# Patient Record
Sex: Male | Born: 1970 | Race: Black or African American | Hispanic: No | Marital: Single | Smoking: Current every day smoker
Health system: Southern US, Community
[De-identification: ages and names within clinical notes are randomized; demographics above are authoritative.]

## PROBLEM LIST (undated history)

## (undated) DIAGNOSIS — J45909 Unspecified asthma, uncomplicated: Secondary | ICD-10-CM

## (undated) DIAGNOSIS — Z889 Allergy status to unspecified drugs, medicaments and biological substances status: Secondary | ICD-10-CM

## (undated) DIAGNOSIS — I10 Essential (primary) hypertension: Secondary | ICD-10-CM

## (undated) HISTORY — PX: APPENDECTOMY: SHX54

---

## 2007-07-23 ENCOUNTER — Inpatient Hospital Stay (HOSPITAL_COMMUNITY): Admission: EM | Admit: 2007-07-23 | Discharge: 2007-07-24 | Payer: Self-pay | Admitting: Emergency Medicine

## 2007-07-23 ENCOUNTER — Encounter (INDEPENDENT_AMBULATORY_CARE_PROVIDER_SITE_OTHER): Payer: Self-pay | Admitting: General Surgery

## 2008-09-20 ENCOUNTER — Emergency Department (HOSPITAL_COMMUNITY): Admission: EM | Admit: 2008-09-20 | Discharge: 2008-09-21 | Payer: Self-pay | Admitting: Emergency Medicine

## 2008-12-03 ENCOUNTER — Emergency Department (HOSPITAL_COMMUNITY): Admission: EM | Admit: 2008-12-03 | Discharge: 2008-12-03 | Payer: Self-pay | Admitting: Emergency Medicine

## 2010-03-22 ENCOUNTER — Emergency Department (HOSPITAL_COMMUNITY): Admission: EM | Admit: 2010-03-22 | Discharge: 2010-03-22 | Payer: Self-pay | Admitting: Emergency Medicine

## 2010-10-21 NOTE — H&P (Signed)
NAMEROBT, OKUDA        ACCOUNT NO.:  [id number]   MEDICAL RECORD NO.:  [id number]          PATIENT TYPE:  INP   LOCATION:  0103                         FACILITY:  [facility]   PHYSICIAN:  Anselm Pancoast. Weatherly, M.D.DATE OF BIRTH:  Aug 30, 1970   DATE OF ADMISSION:  07/23/2007  DATE OF DISCHARGE:                              HISTORY & PHYSICAL   HISTORY:  Carlos Sanford is a 40 year old black male who presented  to the emergency room with the following history.  He said he felt fine  yesterday.  In the early a.m., he started having abdominal pain that  shifted into the lower abdomen.  During the day, the pain just got  worse.  He came to the emergency room late afternoon and was seen by the  ER physician.  He was tender in the lower abdomen.  A white count was  performed which was mildly elevated.  Urinalysis was negative and then a  CT was ordered.  The CT showed an acutely inflamed appendix with large  fecalith in it in the right lower quadrant.  There is no evidence of any  rupture.  I was called about 10 p.m. when the CT was completed.  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   PAST MEDICAL HISTORY:  Not really asthma, but breathing issues.   ALLERGIES:  He said PENICILLIN causes him to swell.   REVIEW OF SYSTEMS:  Otherwise negative.   PAST SURGICAL HISTORY:  He has had no previous surgeries.   MEDICATIONS:  No chronic medications.   PHYSICAL EXAMINATION:  VITAL SIGNS:  When he presented, his temperature  was 98, blood pressure 151/86, respiration 16.  EYES/EARS/NOSE/THROAT:  Unremarkable.  LUNGS:  Clear.  CARDIAC:  Normal sinus rhythm.  ABDOMEN:  He is definitely tender in the right lower quadrant with  muscle guarding and rebound.  RECTAL:  I did not do a rectal/pelvic exams since he had a CT that  showed acutely inflamed appendix.  EXTREMITIES:  There was no pedal edema.  CNS:  Physiologic.   ADMISSION IMPRESSION:  Acute appendicitis.   PLAN:  I  plan to do a laparoscopic appendectomy promptly.  We will give  him 400 mg of Cipro and permission obtained for laparoscopic  appendectomy.           ______________________________  Anselm Pancoast. Zachery Dakins, M.D.    WJW/MEDQ  D:  07/24/2007  T:  07/25/2007  Job:  [id number]

## 2010-10-21 NOTE — Op Note (Signed)
NAMEKINTE, TRIM        ACCOUNT NO.:  [id number]   MEDICAL RECORD NO.:  [id number]          PATIENT TYPE:  INP   LOCATION:  1534                         FACILITY:  [facility]   PHYSICIAN:  Anselm Pancoast. Weatherly, M.D.DATE OF BIRTH:  01-May-1971   DATE OF PROCEDURE:  07/23/2007  DATE OF DISCHARGE:  07/24/2007                               OPERATIVE REPORT   PREOPERATIVE DIAGNOSIS:  Acute appendicitis.   POSTOPERATIVE DIAGNOSIS:  Acute appendicitis.   OPERATION:  Laparoscopic appendectomy.   SURGEON:  Anselm Pancoast. Zachery Dakins, M.D.   ASSISTANT:  Nurse.   HISTORY:  Mikail Goostree is a 40 year old male presented to the  emergency room today with about 20-hour history of progressive abdominal  pain that progressed to the right lower quadrant.  The patient has had  no previous abdominal surgery and is otherwise in good health CT showed  acutely inflamed appendix.  His white count was 13,500.  He was  afebrile.  He was given 400 mg of Cipro permission obtained for  laparoscopic appendectomy.  He was taken over to the operative suite.  Induction of general anesthesia, endotracheal tube, Foley catheter was  inserted sterilely and then the abdomen was shaved around the umbilicus  in areas where trocars were to be inserted.  Betadine solution prepped  the abdomen.  Then I made a small incision just below the umbilicus.  You could feel a little fascial defect in the umbilicus itself and the  skin was elevated from this and then I made a little opening right  through this.  A pursestring suture of 0-0 Vicryl was placed and Hasson  cannula introduced.  You could see a little inflammatory reaction in the  right lower quadrant.  The 10/11 port was placed in the left lower  quadrant and a 5 mm port placed lateral upper right abdomen.  I could  see the appendix and grab it with a Glassman and then could kind of pull  it upward and outward.  There was some adhesions going to the inferior  surface that were carefully divided with the harmonic scalpel.  The  appendix was freed right onto its base.  You could see this large area  fecalith and then we got below this, divided the appendiceal mesentery,  good hemostasis so I see where the appendix definitely joins the cecum.  I then used the endoscopic GIA with a GI load placed across the base of  the appendix, cecum clamped and under direct vision fired the stapler.  The appendix was then placed in the EndoCatch bag and then brought out  with removing the Hasson port.  I then reinserted the Hasson cannula,  irrigated.  The staple line was not bleeding.  There was good  hemostasis.  A little bit of inflammatory reaction around this was  irrigated and aspirated.  I then removed the 10/11 port under direct  vision having placed the camera back in the umbilical port.  The camera  was in the left lower quadrant and I was working predominantly through  the umbilicus and right.  The 5 mL port withdrawn under direct vision.  I then put an  additional figure-of-eight suture of 0-0 Vicryl in the  fascia at the umbilicus, Marcaine and Adrenalin and then one stitch in  the fascia in the left lower quadrant.  The subcuticular  wounds closed with 4-0 Monocryl and Benzoin and Steri-Strips on skin.  The patient tolerated procedure nicely and should be able to go home in  the morning.  Will give him clear liquids and I do not think he will  need any additional antibiotics.           ______________________________  Anselm Pancoast. Zachery Dakins, M.D.     WJW/MEDQ  D:  07/24/2007  T:  07/25/2007  Job:  [id number]

## 2011-02-27 LAB — CBC
HCT: 47.6
Hemoglobin: 16.6
MCHC: 34.8
Platelets: 209
RDW: 13.5

## 2011-02-27 LAB — DIFFERENTIAL
Basophils Relative: 0
Lymphocytes Relative: 9 — ABNORMAL LOW
Lymphs Abs: 1.2
Monocytes Absolute: 0.3
Monocytes Relative: 2 — ABNORMAL LOW
Neutro Abs: 12 — ABNORMAL HIGH
Neutrophils Relative %: 89 — ABNORMAL HIGH

## 2011-02-27 LAB — URINALYSIS, ROUTINE W REFLEX MICROSCOPIC
Glucose, UA: NEGATIVE
Hgb urine dipstick: NEGATIVE
Leukocytes, UA: NEGATIVE
Protein, ur: 30 — AB
Specific Gravity, Urine: 1.033 — ABNORMAL HIGH
Urobilinogen, UA: 1

## 2011-02-27 LAB — URINE MICROSCOPIC-ADD ON

## 2011-02-27 LAB — COMPREHENSIVE METABOLIC PANEL
Albumin: 4.3
BUN: 11
Calcium: 9.4
Creatinine, Ser: 1.09
Glucose, Bld: 156 — ABNORMAL HIGH
Total Protein: 7.8

## 2011-04-20 ENCOUNTER — Emergency Department (HOSPITAL_COMMUNITY): Payer: No Typology Code available for payment source

## 2011-04-20 ENCOUNTER — Emergency Department (HOSPITAL_COMMUNITY)
Admission: EM | Admit: 2011-04-20 | Discharge: 2011-04-20 | Disposition: A | Discharge status: Home / Self Care | Payer: No Typology Code available for payment source | Attending: Emergency Medicine | Admitting: Emergency Medicine

## 2011-04-20 DIAGNOSIS — S139XXA Sprain of joints and ligaments of unspecified parts of neck, initial encounter: Secondary | ICD-10-CM | POA: Insufficient documentation

## 2011-04-20 DIAGNOSIS — M25519 Pain in unspecified shoulder: Secondary | ICD-10-CM | POA: Insufficient documentation

## 2011-04-20 DIAGNOSIS — S8000XA Contusion of unspecified knee, initial encounter: Secondary | ICD-10-CM | POA: Insufficient documentation

## 2011-04-20 DIAGNOSIS — S161XXA Strain of muscle, fascia and tendon at neck level, initial encounter: Secondary | ICD-10-CM

## 2011-04-20 DIAGNOSIS — M546 Pain in thoracic spine: Secondary | ICD-10-CM | POA: Insufficient documentation

## 2011-04-20 DIAGNOSIS — R0789 Other chest pain: Secondary | ICD-10-CM

## 2011-04-20 DIAGNOSIS — Y92410 Unspecified street and highway as the place of occurrence of the external cause: Secondary | ICD-10-CM | POA: Insufficient documentation

## 2011-04-20 DIAGNOSIS — R071 Chest pain on breathing: Secondary | ICD-10-CM | POA: Insufficient documentation

## 2011-04-20 MED ORDER — CYCLOBENZAPRINE HCL 10 MG PO TABS: 10.0000 mg | ORAL_TABLET | Freq: Three times a day (TID) | ORAL | Status: DC | PRN

## 2011-04-20 MED ORDER — IBUPROFEN 800 MG PO TABS: 800.0000 mg | ORAL_TABLET | Freq: Three times a day (TID) | ORAL | Status: AC | PRN

## 2011-04-20 MED ORDER — HYDROCODONE-ACETAMINOPHEN 5-325 MG PO TABS: 1.0000 | ORAL_TABLET | Freq: Four times a day (QID) | ORAL | Status: AC | PRN

## 2011-04-20 MED ORDER — DIAZEPAM 5 MG PO TABS: 5.0000 mg | ORAL_TABLET | Freq: Three times a day (TID) | ORAL | Status: AC | PRN
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
## 2011-04-20 NOTE — ED Notes (Signed)
3 rx given to go.
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
## 2011-04-21 NOTE — ED Provider Notes (Signed)
Medical screening examination/treatment/procedure(s) were performed by non-physician practitioner and as supervising physician I was immediately available for consultation/collaboration.    Rella Egelston R Noella Kipnis, MD 04/21/11 0000 

## 2011-12-23 ENCOUNTER — Emergency Department (HOSPITAL_COMMUNITY)
Admission: EM | Admit: 2011-12-23 | Discharge: 2011-12-23 | Disposition: A | Discharge status: Home / Self Care | Payer: Self-pay | Attending: Emergency Medicine | Admitting: Emergency Medicine

## 2011-12-23 ENCOUNTER — Encounter (HOSPITAL_COMMUNITY): Payer: Self-pay | Admitting: *Deleted

## 2011-12-23 DIAGNOSIS — S1096XA Insect bite of unspecified part of neck, initial encounter: Secondary | ICD-10-CM | POA: Insufficient documentation

## 2011-12-23 DIAGNOSIS — F17200 Nicotine dependence, unspecified, uncomplicated: Secondary | ICD-10-CM | POA: Insufficient documentation

## 2011-12-23 DIAGNOSIS — Y92009 Unspecified place in unspecified non-institutional (private) residence as the place of occurrence of the external cause: Secondary | ICD-10-CM | POA: Insufficient documentation

## 2011-12-23 DIAGNOSIS — W57XXXA Bitten or stung by nonvenomous insect and other nonvenomous arthropods, initial encounter: Secondary | ICD-10-CM | POA: Insufficient documentation

## 2011-12-23 HISTORY — DX: Allergy status to unspecified drugs, medicaments and biological substances: Z88.9

## 2011-12-23 HISTORY — DX: Unspecified asthma, uncomplicated: J45.909

## 2011-12-23 MED ORDER — CEPHALEXIN 500 MG PO CAPS: 500.0000 mg | ORAL_CAPSULE | Freq: Four times a day (QID) | ORAL | Status: AC

## 2011-12-23 MED ORDER — HYDROCHLOROTHIAZIDE 25 MG PO TABS: 25.0000 mg | ORAL_TABLET | Freq: Every day | ORAL | Status: DC

## 2011-12-23 NOTE — ED Notes (Signed)
Patient reports he thinks a spider bit his neck on Saturday.  He has noted large raised area with dark center

## 2011-12-23 NOTE — ED Provider Notes (Signed)
History   This chart was scribed for Nelia Shi, MD by Sofie Rower. The patient was seen in room TR04C/TR04C and the patient's care was started at 11:44 AM     CSN: [id number]  Arrival date & time 12/23/11  1131   None     Chief Complaint  Patient presents with  . Insect Bite    (Consider location/radiation/quality/duration/timing/severity/associated sxs/prior treatment) Patient is a 41 y.o. male presenting with animal bite. The history is provided by the patient. No language interpreter was used.  Animal Bite  The incident occurred more than 2 days ago. The incident occurred at home. He came to the ER via personal transport. Head/neck injury location: bite location to the back of neck. The pain is mild. It is unlikely that a foreign body is present. Pertinent negatives include no fussiness, no numbness, no abdominal pain, no vomiting, no neck pain and no light-headedness. There have been no prior injuries to these areas. His tetanus status is unknown. He has been behaving normally. There were no sick contacts. He has received no recent medical care.    Tykee Heideman is a 41 y.o. male who presents to the Emergency Department complaining of moderate, episodic insect bite onset four days ago. The pt informs the EDP that he believes he was bit by an insect on the back of his neck, where he proceeded to slap the bite location.   Pt has a hx of allergy to penicillins.     Past Medical History  Diagnosis Date  . Asthma   . Multiple allergies     Past Surgical History  Procedure Date  . Appendectomy     No family history on file.  History  Substance Use Topics  . Smoking status: Current Everyday Smoker  . Smokeless tobacco: Not on file  . Alcohol Use: Yes     occasionally      Review of Systems  HENT: Negative for neck pain.   Gastrointestinal: Negative for vomiting and abdominal pain.  Neurological: Negative for light-headedness and numbness.  All other  systems reviewed and are negative.    10 Systems reviewed and all are negative for acute change except as noted in the HPI.    Allergies  Penicillins  Home Medications   Current Outpatient Rx  Name Route Sig Dispense Refill  . NAPROXEN SODIUM 220 MG PO TABS Oral Take 440 mg by mouth 2 (two) times daily as needed. headache    . CEPHALEXIN 500 MG PO CAPS Oral Take 1 capsule (500 mg total) by mouth 4 (four) times daily. 20 capsule 0    BP 175/109  Pulse 85  Temp 98.3 F (36.8 C) (Oral)  Resp 18  SpO2 98%  Physical Exam  Nursing note and vitals reviewed. Constitutional: He is oriented to person, place, and time. He appears well-developed and well-nourished. No distress.  HENT:  Head: Normocephalic and atraumatic.  Nose: Nose normal.  Eyes: Pupils are equal, round, and reactive to light.  Neck: Normal range of motion.    Cardiovascular: Normal rate and intact distal pulses.   Pulmonary/Chest: Effort normal. No respiratory distress.  Abdominal: Normal appearance. He exhibits no distension.  Musculoskeletal: Normal range of motion.  Neurological: He is alert and oriented to person, place, and time. No cranial nerve deficit.  Skin: Skin is warm and dry. No rash noted.  Psychiatric: He has a normal mood and affect. His behavior is normal.    ED Course  Procedures (including critical care  time)  DIAGNOSTIC STUDIES: Oxygen Saturation is 98% on room air, normal by my interpretation.    COORDINATION OF CARE:    11:45AM- EDP at bedside discusses treatment plan concerning application of antibiotics.   Labs Reviewed - No data to display No results found.   1. Insect bite       MDM         I personally performed the services described in this documentation, which was scribed in my presence. The recorded information has been reviewed and considered.    Nelia Shi, MD 12/23/11 [phone]

## 2012-10-21 ENCOUNTER — Emergency Department (HOSPITAL_COMMUNITY): Payer: Self-pay

## 2012-10-21 ENCOUNTER — Encounter (HOSPITAL_COMMUNITY): Payer: Self-pay | Admitting: *Deleted

## 2012-10-21 ENCOUNTER — Inpatient Hospital Stay (HOSPITAL_COMMUNITY)
Admission: EM | Admit: 2012-10-21 | Discharge: 2012-10-24 | DRG: 195 | Disposition: A | Discharge status: Home / Self Care | Payer: 59 | Attending: Internal Medicine | Admitting: Internal Medicine

## 2012-10-21 DIAGNOSIS — J45909 Unspecified asthma, uncomplicated: Secondary | ICD-10-CM | POA: Diagnosis present

## 2012-10-21 DIAGNOSIS — J40 Bronchitis, not specified as acute or chronic: Secondary | ICD-10-CM | POA: Diagnosis present

## 2012-10-21 DIAGNOSIS — R112 Nausea with vomiting, unspecified: Secondary | ICD-10-CM

## 2012-10-21 DIAGNOSIS — R079 Chest pain, unspecified: Secondary | ICD-10-CM

## 2012-10-21 DIAGNOSIS — R9431 Abnormal electrocardiogram [ECG] [EKG]: Secondary | ICD-10-CM

## 2012-10-21 DIAGNOSIS — F17200 Nicotine dependence, unspecified, uncomplicated: Secondary | ICD-10-CM | POA: Diagnosis present

## 2012-10-21 DIAGNOSIS — Z9119 Patient's noncompliance with other medical treatment and regimen: Secondary | ICD-10-CM

## 2012-10-21 DIAGNOSIS — I10 Essential (primary) hypertension: Secondary | ICD-10-CM | POA: Diagnosis present

## 2012-10-21 DIAGNOSIS — IMO0002 Reserved for concepts with insufficient information to code with codable children: Secondary | ICD-10-CM

## 2012-10-21 DIAGNOSIS — J189 Pneumonia, unspecified organism: Principal | ICD-10-CM

## 2012-10-21 DIAGNOSIS — Z720 Tobacco use: Secondary | ICD-10-CM

## 2012-10-21 DIAGNOSIS — Z91199 Patient's noncompliance with other medical treatment and regimen due to unspecified reason: Secondary | ICD-10-CM

## 2012-10-21 DIAGNOSIS — R0789 Other chest pain: Secondary | ICD-10-CM | POA: Diagnosis present

## 2012-10-21 HISTORY — DX: Essential (primary) hypertension: I10

## 2012-10-21 LAB — COMPREHENSIVE METABOLIC PANEL WITH GFR
AST: 15 U/L (ref 0–37)
BUN: 9 mg/dL (ref 6–23)
CO2: 29 meq/L (ref 19–32)
Calcium: 9.8 mg/dL (ref 8.4–10.5)
Chloride: 102 meq/L (ref 96–112)
Creatinine, Ser: 1.1 mg/dL (ref 0.50–1.35)
GFR calc Af Amer: >90 mL/min (ref >90)
GFR calc non Af Amer: 81 mL/min — ABNORMAL LOW (ref >90)
Glucose, Bld: 109 mg/dL — ABNORMAL HIGH (ref 70–99)
Total Bilirubin: 0.5 mg/dL (ref 0.3–1.2)

## 2012-10-21 LAB — CBC WITH DIFFERENTIAL/PLATELET
Basophils Absolute: 0 10*3/uL (ref 0.0–0.1)
Basophils Relative: 0 % (ref 0–1)
Eosinophils Absolute: 0 K/uL (ref 0.0–0.7)
Eosinophils Relative: 0 % (ref 0–5)
HCT: 44.4 % (ref 39.0–52.0)
Hemoglobin: 16.1 g/dL (ref 13.0–17.0)
Lymphocytes Relative: 13 % (ref 12–46)
Lymphs Abs: 1.3 K/uL (ref 0.7–4.0)
MCH: 30.6 pg (ref 26.0–34.0)
MCHC: 36.3 g/dL — ABNORMAL HIGH (ref 30.0–36.0)
MCV: 84.3 fL (ref 78.0–100.0)
Monocytes Absolute: 0.5 10*3/uL (ref 0.1–1.0)
Monocytes Relative: 4 % (ref 3–12)
Neutro Abs: 8.6 K/uL — ABNORMAL HIGH (ref 1.7–7.7)
Neutrophils Relative %: 83 % — ABNORMAL HIGH (ref 43–77)
Platelets: 268 K/uL (ref 150–400)
RBC: 5.27 MIL/uL (ref 4.22–5.81)
RDW: 13.2 % (ref 11.5–15.5)
WBC: 10.4 K/uL (ref 4.0–10.5)

## 2012-10-21 LAB — COMPREHENSIVE METABOLIC PANEL
ALT: 18 U/L (ref 0–53)
Albumin: 4.3 g/dL (ref 3.5–5.2)
Alkaline Phosphatase: 72 U/L (ref 39–117)
Potassium: 4.2 mEq/L (ref 3.5–5.1)
Sodium: 138 mEq/L (ref 135–145)
Total Protein: 7.9 g/dL (ref 6.0–8.3)

## 2012-10-21 LAB — RAPID URINE DRUG SCREEN, HOSP PERFORMED
Amphetamines: NOT DETECTED
Barbiturates: NOT DETECTED
Benzodiazepines: NOT DETECTED
Cocaine: NOT DETECTED
Opiates: NOT DETECTED
Tetrahydrocannabinol: NOT DETECTED

## 2012-10-21 MED ORDER — ASPIRIN 81 MG PO CHEW
162.0000 mg | CHEWABLE_TABLET | Freq: Once | ORAL | Status: AC
  Administered 2012-10-21: 162 mg via ORAL
  Filled 2012-10-21: qty 2

## 2012-10-21 MED ORDER — NITROGLYCERIN 0.4 MG SL SUBL
0.4000 mg | SUBLINGUAL_TABLET | SUBLINGUAL | Status: DC | PRN
  Administered 2012-10-21: 0.4 mg via SUBLINGUAL
  Filled 2012-10-21: qty 25

## 2012-10-21 MED ORDER — FENTANYL CITRATE 0.05 MG/ML IJ SOLN
50.0000 ug | Freq: Once | INTRAMUSCULAR | Status: AC
  Administered 2012-10-21: 50 ug via INTRAVENOUS
  Filled 2012-10-21: qty 2

## 2012-10-21 MED ORDER — ONDANSETRON HCL 4 MG/2ML IJ SOLN
4.0000 mg | Freq: Once | INTRAMUSCULAR | Status: AC
  Administered 2012-10-21: 4 mg via INTRAVENOUS
  Filled 2012-10-21: qty 2

## 2012-10-21 MED ORDER — SODIUM CHLORIDE 0.9 % IJ SOLN: 10.0000 mL | INTRAMUSCULAR | Status: DC | PRN

## 2012-10-21 MED ORDER — NITROGLYCERIN IN D5W 200-5 MCG/ML-% IV SOLN
2.0000 ug/min | Freq: Once | INTRAVENOUS | Status: AC
  Administered 2012-10-22: 5 ug/min via INTRAVENOUS
  Filled 2012-10-21: qty 250

## 2012-10-21 NOTE — ED Notes (Signed)
Pt states that he had high BP prior to CP. Pt states approx 5 hours before his CP started at 10:00 he felt like his BP was elevated. Pt states that he was nauseated and vomiting. Pt states at 10:00 his left side of his chest started having sharp intermittant pain. Pt states that he went to check his BP and it was 184/114. Pt came here after.

## 2012-10-21 NOTE — ED Notes (Signed)
Pt c/o upper left upper chest that is worse with movement and breathing. Pt slightly anxious

## 2012-10-21 NOTE — ED Provider Notes (Signed)
History     CSN: [id number]  Arrival date & time 10/21/12  1929   First MD Initiated Contact with Patient 10/21/12 2050      Chief Complaint  Patient presents with  . Hypertension  . Chest Pain    (Consider location/radiation/quality/duration/timing/severity/associated sxs/prior treatment) HPI Comments: Patient with history of asthma, undiagnosed and untreated hypertension, presents with generalized weakness, drowsiness most of today. He reports 2 episodes of nausea and vomiting without diarrhea or abdominal pain. He has had some chills and episodes of diaphoresis throughout the day. About 2 hours prior to arrival, the patient began having some substernal nonradiating chest discomfort which is improved but still present. He denies prior history of cardiac disease. He does smoke cigarettes, reports both parents have had MIs and problems with congestive heart failure and coronary disease in the past. He does not have a primary care physician. He did not take any aspirin or any other medications prior to arrival. Patient reports subsequently he has developed somewhat of a headache mostly on the left side of his head that is sharp in nature. He denies blurry vision, photophobia, stiff neck. Denies any skin rash, abdominal pain, flank pain.  The history is provided by the patient and a relative.    Past Medical History  Diagnosis Date  . Asthma   . Multiple allergies   . Bronchitis     Past Surgical History  Procedure Laterality Date  . Appendectomy      History reviewed. No pertinent family history.  History  Substance Use Topics  . Smoking status: Current Every Day Smoker  . Smokeless tobacco: Not on file  . Alcohol Use: Yes     Comment: occasionally      Review of Systems  Constitutional: Positive for chills, diaphoresis and fatigue. Negative for fever.  HENT: Negative for congestion, rhinorrhea, neck pain and neck stiffness.   Eyes: Negative for photophobia and visual  disturbance.  Respiratory: Positive for chest tightness and shortness of breath. Negative for cough.   Cardiovascular: Positive for chest pain. Negative for palpitations.  Gastrointestinal: Positive for nausea and vomiting. Negative for abdominal pain and diarrhea.  Genitourinary: Negative for dysuria and flank pain.  Musculoskeletal: Negative for back pain.  Skin: Negative for rash.  Neurological: Positive for headaches. Negative for syncope and light-headedness.  All other systems reviewed and are negative.    Allergies  Penicillins  Home Medications  No current outpatient prescriptions on file.  BP 152/101  Pulse 66  Temp(Src) 98.3 F (36.8 C) (Oral)  Resp 14  SpO2 97%  Physical Exam  Nursing note and vitals reviewed. Constitutional: He appears well-developed and well-nourished. No distress.  HENT:  Head: Normocephalic and atraumatic.  Eyes: Conjunctivae and EOM are normal. No scleral icterus.  Neck: Normal range of motion. Neck supple.  Cardiovascular: Normal rate, regular rhythm and intact distal pulses.   No murmur heard. Pulmonary/Chest: Effort normal. No respiratory distress. He has no wheezes.  Abdominal: Soft. He exhibits no distension. There is no tenderness. There is no rebound and no guarding.  Neurological: He is alert. He exhibits normal muscle tone. Coordination normal.  Skin: Skin is warm. He is not diaphoretic.  Psychiatric: He has a normal mood and affect.    ED Course  Procedures (including critical care time)    CRITICAL CARE Performed by: Lear Ng. Total critical care time: 30 min Critical care time was exclusive of separately billable procedures and treating other patients. Critical care was necessary to treat  or prevent imminent or life-threatening deterioration. Critical care was time spent personally by me on the following activities: development of treatment plan with patient and/or surrogate as well as nursing, discussions with  consultants, evaluation of patient's response to treatment, examination of patient, obtaining history from patient or surrogate, ordering and performing treatments and interventions, ordering and review of laboratory studies, ordering and review of radiographic studies, pulse oximetry and re-evaluation of patient's condition.   Labs Reviewed  COMPREHENSIVE METABOLIC PANEL - Abnormal; Notable for the following:    Glucose, Bld 109 (*)    GFR calc non Af Amer 81 (*)    All other components within normal limits  CBC WITH DIFFERENTIAL - Abnormal; Notable for the following:    MCHC 36.3 (*)    Neutrophils Relative % 83 (*)    Neutro Abs 8.6 (*)    All other components within normal limits  URINE RAPID DRUG SCREEN (HOSP PERFORMED)  POCT I-STAT TROPONIN I   Dg Chest 2 View  10/21/2012   *RADIOLOGY REPORT*  Clinical Data: Elevated blood pressure.  Chest pain and headache.  CHEST - 2 VIEW  Comparison: Chest radiograph 04/20/2011  Findings: The heart, mediastinal, and hilar contours are normal. Normal pulmonary vascularity.  Midline trachea.  The lungs are clear.  There is no pleural effusion or pneumothorax.  Bony thorax is unremarkable.  IMPRESSION: No acute cardiopulmonary disease.   Original Report Authenticated By: Britta Mccreedy, M.D.     1. Chest pain   2. Nausea and vomiting   3. Hypertension   4. Abnormal ECG     Room air saturation is 97% I interpret this to be normal.  EKG at time 19:42, shows normal sinus rhythm at a rate of 60, inferolateral ST and T-wave abnormalities with J-point elevation in anterior septal leads. Interpretation is abnormal EKG  11:40 PM Pt reports chest tightness is down from a 9/10 to about 5/10 after NTG.  Still with BP of 180/114, will start NTG drip for continued CP and possibly HTN crisis.  Initial troponin is neg, head CT shows no hemorrhage on my interpretation.  Will consult [facility] to admit, likely needs step down due to continued CP and NTG  gtt.    MDM   Patient with somewhat atypical chest pain as it began several hours and to symptom onset. Patient has been fatigued, sleepy all day, associated with nausea and vomiting, headache, generalized weakness. No fever here. Patient is quite hypertensive with history of being noncompliant. Patient does smoke, denies any drug use. 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 His abdomen is soft and no guarding or rebound. Initial troponin is normal. However given his chest pain, diaphoresis, nausea and vomiting, patient will warrant admission for cardiac rule out, and treatment of his hypertension. No focal neurologic deficits, despite his headache, I don't suspect the patient has stroke symptoms        Carlos Sanford. Oletta Lamas, MD 10/21/12 [phone]

## 2012-10-22 ENCOUNTER — Encounter (HOSPITAL_COMMUNITY): Payer: Self-pay | Admitting: *Deleted

## 2012-10-22 ENCOUNTER — Observation Stay (HOSPITAL_COMMUNITY): Payer: Self-pay

## 2012-10-22 DIAGNOSIS — R079 Chest pain, unspecified: Secondary | ICD-10-CM

## 2012-10-22 DIAGNOSIS — J189 Pneumonia, unspecified organism: Principal | ICD-10-CM

## 2012-10-22 DIAGNOSIS — J40 Bronchitis, not specified as acute or chronic: Secondary | ICD-10-CM | POA: Diagnosis present

## 2012-10-22 DIAGNOSIS — R0789 Other chest pain: Secondary | ICD-10-CM | POA: Diagnosis present

## 2012-10-22 DIAGNOSIS — I10 Essential (primary) hypertension: Secondary | ICD-10-CM | POA: Diagnosis present

## 2012-10-22 DIAGNOSIS — J45909 Unspecified asthma, uncomplicated: Secondary | ICD-10-CM

## 2012-10-22 DIAGNOSIS — R9431 Abnormal electrocardiogram [ECG] [EKG]: Secondary | ICD-10-CM

## 2012-10-22 DIAGNOSIS — Z720 Tobacco use: Secondary | ICD-10-CM

## 2012-10-22 DIAGNOSIS — I517 Cardiomegaly: Secondary | ICD-10-CM

## 2012-10-22 LAB — CREATININE, SERUM
Creatinine, Ser: 1.06 mg/dL (ref 0.50–1.35)
GFR calc non Af Amer: 85 mL/min — ABNORMAL LOW (ref >90)

## 2012-10-22 LAB — CBC
Hemoglobin: 15.5 g/dL (ref 13.0–17.0)
MCHC: 36.6 g/dL — ABNORMAL HIGH (ref 30.0–36.0)
Platelets: 259 10*3/uL (ref 150–400)
RBC: 5.07 MIL/uL (ref 4.22–5.81)

## 2012-10-22 LAB — TROPONIN I
Troponin I: <0.3 ng/mL (ref <0.30)
Troponin I: <0.3 ng/mL (ref <0.30)
Troponin I: <0.3 ng/mL (ref <0.30)

## 2012-10-22 MED ORDER — SODIUM CHLORIDE 0.9 % IJ SOLN
3.0000 mL | Freq: Two times a day (BID) | INTRAMUSCULAR | Status: DC
  Administered 2012-10-22 – 2012-10-23 (×3): 3 mL via INTRAVENOUS

## 2012-10-22 MED ORDER — SODIUM CHLORIDE 0.9 % IV SOLN: 250.0000 mL | INTRAVENOUS | Status: DC | PRN

## 2012-10-22 MED ORDER — IOHEXOL 350 MG/ML SOLN
100.0000 mL | Freq: Once | INTRAVENOUS | Status: AC | PRN
  Administered 2012-10-22: 100 mL via INTRAVENOUS

## 2012-10-22 MED ORDER — ALBUTEROL SULFATE (5 MG/ML) 0.5% IN NEBU
INHALATION_SOLUTION | RESPIRATORY_TRACT | Status: AC
  Filled 2012-10-22: qty 1

## 2012-10-22 MED ORDER — AZITHROMYCIN 500 MG PO TABS
500.0000 mg | ORAL_TABLET | Freq: Every day | ORAL | Status: DC
  Administered 2012-10-23 – 2012-10-24 (×2): 500 mg via ORAL
  Filled 2012-10-22 (×2): qty 1

## 2012-10-22 MED ORDER — MORPHINE SULFATE 4 MG/ML IJ SOLN
4.0000 mg | Freq: Once | INTRAMUSCULAR | Status: AC
  Administered 2012-10-22: 4 mg via INTRAVENOUS
  Filled 2012-10-22: qty 1

## 2012-10-22 MED ORDER — IPRATROPIUM BROMIDE 0.02 % IN SOLN
RESPIRATORY_TRACT | Status: AC
  Filled 2012-10-22: qty 2.5

## 2012-10-22 MED ORDER — ASPIRIN EC 325 MG PO TBEC
325.0000 mg | DELAYED_RELEASE_TABLET | Freq: Every day | ORAL | Status: DC
  Administered 2012-10-22: 325 mg via ORAL
  Filled 2012-10-22 (×2): qty 1

## 2012-10-22 MED ORDER — ONDANSETRON HCL 4 MG/2ML IJ SOLN: 4.0000 mg | Freq: Four times a day (QID) | INTRAMUSCULAR | Status: DC | PRN

## 2012-10-22 MED ORDER — SODIUM CHLORIDE 0.9 % IJ SOLN
3.0000 mL | Freq: Two times a day (BID) | INTRAMUSCULAR | Status: DC
  Administered 2012-10-22 – 2012-10-24 (×5): 3 mL via INTRAVENOUS

## 2012-10-22 MED ORDER — NITROGLYCERIN IN D5W 200-5 MCG/ML-% IV SOLN
2.0000 ug/min | INTRAVENOUS | Status: DC
  Administered 2012-10-22: 10 ug/min via INTRAVENOUS

## 2012-10-22 MED ORDER — MORPHINE SULFATE 2 MG/ML IJ SOLN
4.0000 mg | INTRAMUSCULAR | Status: DC | PRN
  Filled 2012-10-22: qty 1

## 2012-10-22 MED ORDER — DEXTROSE 5 % IV SOLN
1.0000 g | INTRAVENOUS | Status: DC
  Administered 2012-10-22 – 2012-10-24 (×3): 1 g via INTRAVENOUS
  Filled 2012-10-22 (×3): qty 10

## 2012-10-22 MED ORDER — ENOXAPARIN SODIUM 40 MG/0.4ML ~~LOC~~ SOLN
40.0000 mg | Freq: Every day | SUBCUTANEOUS | Status: DC
  Administered 2012-10-22 – 2012-10-24 (×3): 40 mg via SUBCUTANEOUS
  Filled 2012-10-22 (×3): qty 0.4

## 2012-10-22 MED ORDER — SODIUM CHLORIDE 0.9 % IJ SOLN: 3.0000 mL | INTRAMUSCULAR | Status: DC | PRN

## 2012-10-22 MED ORDER — METOPROLOL TARTRATE 25 MG PO TABS
25.0000 mg | ORAL_TABLET | Freq: Two times a day (BID) | ORAL | Status: DC
  Administered 2012-10-22 (×2): 25 mg via ORAL
  Filled 2012-10-22 (×4): qty 1

## 2012-10-22 MED ORDER — ONDANSETRON HCL 4 MG PO TABS: 4.0000 mg | ORAL_TABLET | Freq: Four times a day (QID) | ORAL | Status: DC | PRN

## 2012-10-22 MED ORDER — PNEUMOCOCCAL VAC POLYVALENT 25 MCG/0.5ML IJ INJ
0.5000 mL | INJECTION | INTRAMUSCULAR | Status: AC
  Administered 2012-10-23: 0.5 mL via INTRAMUSCULAR
  Filled 2012-10-22: qty 0.5

## 2012-10-22 MED ORDER — ALBUTEROL SULFATE (5 MG/ML) 0.5% IN NEBU
2.5000 mg | INHALATION_SOLUTION | Freq: Four times a day (QID) | RESPIRATORY_TRACT | Status: DC
  Administered 2012-10-22 – 2012-10-23 (×7): 2.5 mg via RESPIRATORY_TRACT
  Filled 2012-10-22 (×8): qty 0.5

## 2012-10-22 MED ORDER — ACETAMINOPHEN 325 MG PO TABS
650.0000 mg | ORAL_TABLET | Freq: Four times a day (QID) | ORAL | Status: DC | PRN
  Administered 2012-10-22 (×2): 650 mg via ORAL
  Filled 2012-10-22 (×2): qty 2

## 2012-10-22 MED ORDER — DEXTROSE 5 % IV SOLN
500.0000 mg | INTRAVENOUS | Status: DC
  Administered 2012-10-22: 500 mg via INTRAVENOUS
  Filled 2012-10-22: qty 500

## 2012-10-22 MED ORDER — ONDANSETRON 4 MG PO TBDP
8.0000 mg | ORAL_TABLET | Freq: Once | ORAL | Status: AC
  Administered 2012-10-22: 8 mg via ORAL
  Filled 2012-10-22: qty 2

## 2012-10-22 NOTE — H&P (Signed)
[facility] History and Physical  Rafiel Mecca RUE:[id number] DOB: 1971/05/01    PCP:   None.  Chief Complaint: chest pain.  HPI: Carlos Sanford is an 42 y.o. male with hx of asthma, HTN, noncompliance with meds, presents to the ER with severe chest pain.  Pain is a little to the left of his chest.  He also has some shortness of breath, a little nausea, but no vomiting.  He denied any leg swelling or calf pain.  He does have family hx of CAD, but could n't tell how old his parents were when they had CAD.  He does smoke, but denied any drug use (specifically no cocaine use).  Evalution in the ER showed hypertensive response with SBP 190.  EKG showed ST depression with T waves inversion on inferolateral leads.  His initial troponin was negative.  He has normal renal fx tests, normal LFTs, and no leukocytosis.  He admitted to having productive coughs for the past few days, and having sweats.  He was started on IV NTG, and given MS, which caused some nausea.  His pain came down to 3/10, and hospitalist was asked to admit him for chest pain and HTN.  Rewiew of Systems:  Constitutional: Negative for malaise. No significant weight loss or weight gain Eyes: Negative for eye pain, redness and discharge, diplopia, visual changes, or flashes of light. ENMT: Negative for ear pain, hoarseness, nasal congestion, sinus pressure and sore throat. No headaches; tinnitus, drooling, or problem swallowing. Cardiovascular: Negative for palpitations,and peripheral edema. ; No orthopnea, PND Respiratory: Negative for cough, hemoptysis, wheezing and stridor. No pleuritic chestpain. Gastrointestinal: Negative for nausea, vomiting, diarrhea, constipation, abdominal pain, melena, blood in stool, hematemesis, jaundice and rectal bleeding.    Genitourinary: Negative for frequency, dysuria, incontinence,flank pain and hematuria; Musculoskeletal: Negative for back pain and neck pain. Negative for swelling  and trauma.;  Skin: . Negative for pruritus, rash, abrasions, bruising and skin lesion.; ulcerations Neuro: Negative for headache, lightheadedness and neck stiffness. Negative for weakness, altered level of consciousness , altered mental status, extremity weakness, burning feet, involuntary movement, seizure and syncope.  Psych: negative for anxiety, depression, insomnia, tearfulness, panic attacks, hallucinations, paranoia, suicidal or homicidal ideation    Past Medical History  Diagnosis Date  . Asthma   . Multiple allergies   . Bronchitis   . Hypertension     Past Surgical History  Procedure Laterality Date  . Appendectomy      Medications:  HOME MEDS: Prior to Admission medications   Not on File     Allergies:  Allergies  Allergen Reactions  . Penicillins Itching    When he was a kid    Social History:   reports that he has been smoking.  He does not have any smokeless tobacco history on file. He reports that  drinks alcohol. He reports that he does not use illicit drugs.  Family History: History reviewed. No pertinent family history.   Physical Exam: Filed Vitals:   10/22/12 0115 10/22/12 0330 10/22/12 0345 10/22/12 0454  BP: 160/100 138/68 151/91   Pulse: 64 68 63 70  Temp:      TempSrc:      Resp: 13 12 13 16   Height:    5\' 10"  (1.778 m)  Weight:    84.5 kg (186 lb 4.6 oz)  SpO2: 95% 96% 95% 97%   Blood pressure 151/91, pulse 70, temperature 98.3 F (36.8 C), temperature source Oral, resp. rate 16, height 5\' 10"  (1.778 m),  weight 84.5 kg (186 lb 4.6 oz), SpO2 97.00%.  GEN:  Pleasant  patient lying in the stretcher in no acute distress; cooperative with exam. PSYCH:  alert and oriented x4; does not appear anxious or depressed; affect is appropriate. HEENT: Mucous membranes pink and anicteric; PERRLA; EOM intact; no cervical lymphadenopathy nor thyromegaly or carotid bruit; no JVD; There were no stridor. Neck is very supple. Breasts:: Not  examined CHEST WALL: No tenderness CHEST: Normal respiration, clear to auscultation bilaterally.  HEART: Regular rate and rhythm.  There are no murmur, rub, or gallops.   BACK: No kyphosis or scoliosis; no CVA tenderness ABDOMEN: soft and non-tender; no masses, no organomegaly, normal abdominal bowel sounds; no pannus; no intertriginous candida. There is no rebound and no distention. Rectal Exam: Not done EXTREMITIES: No bone or joint deformity; age-appropriate arthropathy of the hands and knees; no edema; no ulcerations.  There is no calf tenderness. Genitalia: not examined PULSES: 2+ and symmetric SKIN: Normal hydration no rash or ulceration CNS: Cranial nerves 2-12 grossly intact no focal lateralizing neurologic deficit.  Speech is fluent; uvula elevated with phonation, facial symmetry and tongue midline. DTR are normal bilaterally, cerebella exam is intact, barbinski is negative and strengths are equaled bilaterally.  No sensory loss.   Labs on Admission:  Basic Metabolic Panel:  Recent Labs Lab 10/21/12 1947  NA 138  K 4.2  CL 102  CO2 29  GLUCOSE 109*  BUN 9  CREATININE 1.10  CALCIUM 9.8   Liver Function Tests:  Recent Labs Lab 10/21/12 1947  AST 15  ALT 18  ALKPHOS 72  BILITOT 0.5  PROT 7.9  ALBUMIN 4.3   No results found for this basename: LIPASE, AMYLASE,  in the last 168 hours No results found for this basename: AMMONIA,  in the last 168 hours CBC:  Recent Labs Lab 10/21/12 1947  WBC 10.4  NEUTROABS 8.6*  HGB 16.1  HCT 44.4  MCV 84.3  PLT 268   Cardiac Enzymes: No results found for this basename: CKTOTAL, CKMB, CKMBINDEX, TROPONINI,  in the last 168 hours  CBG: No results found for this basename: GLUCAP,  in the last 168 hours   Radiological Exams on Admission: Dg Chest 2 View  10/21/2012   *RADIOLOGY REPORT*  Clinical Data: Elevated blood pressure.  Chest pain and headache.  CHEST - 2 VIEW  Comparison: Chest radiograph 04/20/2011  Findings:  The heart, mediastinal, and hilar contours are normal. Normal pulmonary vascularity.  Midline trachea.  The lungs are clear.  There is no pleural effusion or pneumothorax.  Bony thorax is unremarkable.  IMPRESSION: No acute cardiopulmonary disease.   Original Report Authenticated By: Britta Mccreedy, M.D.   Ct Head Wo Contrast  10/21/2012   *RADIOLOGY REPORT*  Clinical Data: Hypertension and chest pain  CT HEAD WITHOUT CONTRAST  Technique:  Contiguous axial images were obtained from the base of the skull through the vertex without contrast.  Comparison: None.  Findings: The brain has a normal appearance without evidence for hemorrhage, infarction, hydrocephalus, or mass lesion.  There is no extra axial fluid collection.  The skull and paranasal sinuses are normal.  IMPRESSION: Negative exam.   Original Report Authenticated By: Signa Kell, M.D.    EKG: Independently reviewed. There is ST depression with T waves inversions in the inferolateral leads.   Assessment/Plan Present on Admission:  . Atypical chest pain . HTN (hypertension) . Asthma . Bronchitis  PLAN:  He has atypical chest pain which came acutely, and  having non specific EKG changes.  Will admit him for R/out.  I will continue him on IV NTG and give ASA.  He was started on Lopressor PO BID for his BP.  Will also obtain CT of his chest with contrast to exclude PE.  I think he has some bronchitis also and will be started on IV Zithromax.  I urged him to stop cigarettes.  He is stable, full code, and will be admitted to [facility] service.  Other plans as per orders.  Code Status: FULL Unk Lightning, MD. [facility] Pager [phone] 7pm to 7am.  10/22/2012, 4:59 AM

## 2012-10-22 NOTE — Progress Notes (Signed)
[facility] PROGRESS NOTE  Jamill Wetmore ZOX:[id number] DOB: 1970-11-24 DOA: 10/21/2012 PCP: Default, Provider, MD  Assessment/Plan: Pneumonia, likely community-acquired -Add ceftriaxone -Remote history of penicillin allergy, the patient states that he is taking penicillin since then -Continue ceftriaxone, change azithromycin to by mouth -Urine Legionella antigen -Urine Streptococcus pneumoniae antigen -HIV test Abnormal EKG -Troponins negative x2 -T wave inversion II, III, aVF, V5, V6 -Consult cardiology -Urine drug screen is negative Hypertension -Will likely need to be started on antihypertensive medications during this admission--metoprolol tartrate has been start -Continue aspirin Tobacco abuse -tobacco cessation discussed -15-pack-year history Asthma history -Clinically compensated without exacerbation -Continue albuterol every 6 hours   Family Communication:   wife at beside--total time 60 minutes spent speaking with patient, wife and coordinating care Disposition Plan:   Home when medically stable    Antibiotics:  Ceftriaxone/azithromycin 10/22/12>>>    Procedures/Studies: Dg Chest 2 View  10/21/2012   *RADIOLOGY REPORT*  Clinical Data: Elevated blood pressure.  Chest pain and headache.  CHEST - 2 VIEW  Comparison: Chest radiograph 04/20/2011  Findings: The heart, mediastinal, and hilar contours are normal. Normal pulmonary vascularity.  Midline trachea.  The lungs are clear.  There is no pleural effusion or pneumothorax.  Bony thorax is unremarkable.  IMPRESSION: No acute cardiopulmonary disease.   Original Report Authenticated By: Britta Mccreedy, M.D.   Ct Head Wo Contrast  10/21/2012   *RADIOLOGY REPORT*  Clinical Data: Hypertension and chest pain  CT HEAD WITHOUT CONTRAST  Technique:  Contiguous axial images were obtained from the base of the skull through the vertex without contrast.  Comparison: None.  Findings: The brain has a normal appearance  without evidence for hemorrhage, infarction, hydrocephalus, or mass lesion.  There is no extra axial fluid collection.  The skull and paranasal sinuses are normal.  IMPRESSION: Negative exam.   Original Report Authenticated By: Signa Kell, M.D.   Ct Angio Chest Pe W/cm &/or Wo Cm  10/22/2012   *RADIOLOGY REPORT*  Clinical Data: Left upper chest pain, pleuritic.  History of asthma.  Hypertension.  CT ANGIOGRAPHY CHEST  Technique:  Multidetector CT imaging of the chest using the standard protocol during bolus administration of intravenous contrast. Multiplanar reconstructed images including MIPs were obtained and reviewed to evaluate the vascular anatomy.  Contrast: OMNIPAQUE IOHEXOL 350 MG/ML SOLN  Comparison: 10/21/2012 radiographs  Findings: No filling defect is identified in the pulmonary arterial tree to suggest pulmonary embolus.  AP window lymph node short axis 1.2 cm.  Right hilar node short axis 1.0 cm.  Prominent left infrahilar nodal tissue.  No reversal of the interventricular septal contour.  Ill-defined airspace opacities and ground-glass opacities in the right middle lobe posteriorly.  Airway plugging and thickening noted especially in the lower lobes with associated reticulonodular interstitial opacity in the left lower lobe probably representing atypical infectious process, and patchy opacity in the lingula with suspected airway plugging.  IMPRESSION:  1.  Considerable airway plugging in the lower lobes with some airway plugging in the lingula, and reticulonodular opacities in the left lower lobe favoring atypical infectious process.  Mild adenopathy in the right hilum and AP window is likely reactive. 2.  No embolus identified.   Original Report Authenticated By: Gaylyn Rong, M.D.         Subjective:  Patient continues to complain of intermittent chest discomfort. Breathing is better than yesterday. Denies any diarrhea but complains of some nausea and vomiting yesterday  with abdominal cramping. Nausea vomiting appear to have improved.  Denies dysuria hematuria. No rashes. No dizziness.  Objective: Filed Vitals:   10/22/12 0615 10/22/12 0630 10/22/12 0700 10/22/12 0732  BP: 153/99 147/94 125/76 143/89  Pulse: 62 62 77 77  Temp:    97.9 F (36.6 C)  TempSrc:    Oral  Resp: 14 13 13 16   Height:      Weight:      SpO2: 95% 96% 95% 97%    Intake/Output Summary (Last 24 hours) at 10/22/12 1610 Last data filed at 10/22/12 0700  Gross per 24 hour  Intake      9 ml  Output      0 ml  Net      9 ml   Weight change:  Exam:   General:  Pt is alert, follows commands appropriately, not in acute distress  HEENT: No icterus, No thrush,  [place]/AT  Cardiovascular: RRR, S1/S2, no rubs, no gallops  Respiratory: bibasilar crackles, right greater than left. No wheezes or rhonchi. Good air movement.   Abdomen: Soft/+BS, non tender, non distended, no guarding  Extremities: No edema, No lymphangitis, No petechiae, No rashes, no synovitis  Data Reviewed: Basic Metabolic Panel:  Recent Labs Lab 10/21/12 1947 10/22/12 0550  NA 138  --   K 4.2  --   CL 102  --   CO2 29  --   GLUCOSE 109*  --   BUN 9  --   CREATININE 1.10 1.06  CALCIUM 9.8  --    Liver Function Tests:  Recent Labs Lab 10/21/12 1947  AST 15  ALT 18  ALKPHOS 72  BILITOT 0.5  PROT 7.9  ALBUMIN 4.3   No results found for this basename: LIPASE, AMYLASE,  in the last 168 hours No results found for this basename: AMMONIA,  in the last 168 hours CBC:  Recent Labs Lab 10/21/12 1947 10/22/12 0550  WBC 10.4 10.6*  NEUTROABS 8.6*  --   HGB 16.1 15.5  HCT 44.4 42.4  MCV 84.3 83.6  PLT 268 259   Cardiac Enzymes:  Recent Labs Lab 10/22/12 0550  TROPONINI <0.30   BNP: No components found with this basename: POCBNP,  CBG: No results found for this basename: GLUCAP,  in the last 168 hours  Recent Results (from the past 240 hour(s))  MRSA PCR SCREENING     Status: None    Collection Time    10/22/12  5:13 AM      Result Value Range Status   MRSA by PCR NEGATIVE  NEGATIVE Final   Comment:            The GeneXpert MRSA Assay (FDA     approved for NASAL specimens     only), is one component of a     comprehensive MRSA colonization     surveillance program. It is not     intended to diagnose MRSA     infection nor to guide or     monitor treatment for     MRSA infections.     Scheduled Meds: . albuterol  2.5 mg Nebulization Q6H  . aspirin EC  325 mg Oral Daily  . azithromycin  500 mg Intravenous Q24H  . enoxaparin (LOVENOX) injection  40 mg Subcutaneous Daily  . metoprolol tartrate  25 mg Oral BID  . [START ON 10/23/2012] pneumococcal 23 valent vaccine  0.5 mL Intramuscular Tomorrow-1000  . sodium chloride  3 mL Intravenous Q12H  . sodium chloride  3 mL Intravenous Q12H   Continuous  Infusions: . nitroGLYCERIN 10 mcg/min (10/22/12 0621)     Malky Rudzinski, DO  [facility] Pager [phone]  If 7PM-7AM, please contact night-coverage [email or web address] Password [email or web address] 10/22/2012, 9:09 AM   LOS: 1 day

## 2012-10-22 NOTE — Consult Note (Signed)
HPI: 42 year old male for evaluation of chest pain. Patient has no prior cardiac history. He typically does not have dyspnea on exertion, orthopnea, PND, pedal edema, palpitations, syncope or exertional chest pain. Over the past one and one half weeks he has developed a cough productive of a greenish sputum. He had subjective fever as well. Yesterday morning he had nausea and vomiting. He also described chest tightness that increased with cough and inspiration. He also would have a sharp pain in the left chest for one to 2 seconds which would resolve spontaneously. He has been admitted and diagnosed with pneumonia. Cardiology is asked to evaluate.  No prescriptions prior to admission    Allergies  Allergen Reactions  . Penicillins Itching    When he was a kid    Past Medical History  Diagnosis Date  . Asthma   . Multiple allergies   . Hypertension     Past Surgical History  Procedure Laterality Date  . Appendectomy      History   Social History  . Marital Status: Single    Spouse Name: N/A    Number of Children: 4  . Years of Education: N/A   Occupational History  .      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 Smoking status: Current Every Day Smoker  . Smokeless tobacco: Not on file  . Alcohol Use: Yes     Comment: occasionally  . Drug Use: No  . Sexually Active: Not on file   Other Topics Concern  . Not on file   Social History Narrative  . No narrative on file    Family History  Problem Relation Age of Onset  . CAD Mother     Unknown    ROS:  Subjective fever and productive cough but hemoptysis, dysphasia, odynophagia, melena, hematochezia, dysuria, hematuria, rash, seizure activity, orthopnea, PND, pedal edema, claudication. Remaining systems are negative.  Physical Exam:   Blood pressure 141/97, pulse 63, temperature 97.9 F (36.6 C), temperature source Oral, resp. rate 14, height 5\' 10"  (1.778 m), weight 186 lb 4.6 oz (84.5 kg), SpO2  98.00%.  General:  Well developed/well nourished in NAD Skin warm/dry, tattoos Patient not depressed No peripheral clubbing Back-normal HEENT-normal/normal eyelids Neck supple/normal carotid upstroke bilaterally; no bruits; no JVD; no thyromegaly chest - CTA/ normal expansion CV - RRR/normal S1 and S2; no murmurs, rubs or gallops;  PMI nondisplaced Abdomen -NT/ND, no HSM, no mass, + bowel sounds, no bruit 2+ femoral pulses, no bruits Ext-no edema, chords, 2+ DP Neuro-grossly nonfocal  ECG sinus rhythm with inferior lateral T-wave inversion.  Results for orders placed during the hospital encounter of 10/21/12 (from the past 48 hour(s))  COMPREHENSIVE METABOLIC PANEL     Status: Abnormal   Collection Time    10/21/12  7:47 PM      Result Value Range   Sodium 138  135 - 145 mEq/L   Potassium 4.2  3.5 - 5.1 mEq/L   Chloride 102  96 - 112 mEq/L   CO2 29  19 - 32 mEq/L   Glucose, Bld 109 (*) 70 - 99 mg/dL   BUN 9  6 - 23 mg/dL   Creatinine, Ser 1.61  0.50 - 1.35 mg/dL   Calcium 9.8  8.4 - 09.6 mg/dL   Total Protein 7.9  6.0 - 8.3 g/dL   Albumin 4.3  3.5 - 5.2 g/dL   AST 15  0 - 37 U/L   ALT 18  0 - 53 U/L   Alkaline Phosphatase 72  39 - 117 U/L   Total Bilirubin 0.5  0.3 - 1.2 mg/dL   GFR calc non Af Amer 81 (*) >90 mL/min   GFR calc Af Amer >90  >90 mL/min   Comment:            The eGFR has been calculated     using the CKD EPI equation.     This calculation has not been     validated in all clinical     situations.     eGFR's persistently     <90 mL/min signify     possible Chronic Kidney Disease.  CBC WITH DIFFERENTIAL     Status: Abnormal   Collection Time    10/21/12  7:47 PM      Result Value Range   WBC 10.4  4.0 - 10.5 K/uL   RBC 5.27  4.22 - 5.81 MIL/uL   Hemoglobin 16.1  13.0 - 17.0 g/dL   HCT 16.1  09.6 - 04.5 %   MCV 84.3  78.0 - 100.0 fL   MCH 30.6  26.0 - 34.0 pg   MCHC 36.3 (*) 30.0 - 36.0 g/dL   RDW 40.9  81.1 - 91.4 %   Platelets 268  150 - 400  K/uL   Neutrophils Relative % 83 (*) 43 - 77 %   Neutro Abs 8.6 (*) 1.7 - 7.7 K/uL   Lymphocytes Relative 13  12 - 46 %   Lymphs Abs 1.3  0.7 - 4.0 K/uL   Monocytes Relative 4  3 - 12 %   Monocytes Absolute 0.5  0.1 - 1.0 K/uL   Eosinophils Relative 0  0 - 5 %   Eosinophils Absolute 0.0  0.0 - 0.7 K/uL   Basophils Relative 0  0 - 1 %   Basophils Absolute 0.0  0.0 - 0.1 K/uL  POCT I-STAT TROPONIN I     Status: None   Collection Time    10/21/12  8:04 PM      Result Value Range   Troponin i, poc 0.00  0.00 - 0.08 ng/mL   Comment 3            Comment: Due to the release kinetics of cTnI,     a negative result within the first hours     of the onset of symptoms does not rule out     myocardial infarction with certainty.     If myocardial infarction is still suspected,     repeat the test at appropriate intervals.  URINE RAPID DRUG SCREEN (HOSP PERFORMED)     Status: None   Collection Time    10/21/12 10:05 PM      Result Value Range   Opiates NONE DETECTED  NONE DETECTED   Cocaine NONE DETECTED  NONE DETECTED   Benzodiazepines NONE DETECTED  NONE DETECTED   Amphetamines NONE DETECTED  NONE DETECTED   Tetrahydrocannabinol NONE DETECTED  NONE DETECTED   Barbiturates NONE DETECTED  NONE DETECTED   Comment:            DRUG SCREEN FOR MEDICAL PURPOSES     ONLY.  IF CONFIRMATION IS NEEDED     FOR ANY PURPOSE, NOTIFY LAB     WITHIN 5 DAYS.                LOWEST DETECTABLE LIMITS     FOR URINE DRUG SCREEN     Drug Class  Cutoff (ng/mL)     Amphetamine      1000     Barbiturate      200     Benzodiazepine   200     Tricyclics       300     Opiates          300     Cocaine          300     THC              50  MRSA PCR SCREENING     Status: None   Collection Time    10/22/12  5:13 AM      Result Value Range   MRSA by PCR NEGATIVE  NEGATIVE   Comment:            The GeneXpert MRSA Assay (FDA     approved for NASAL specimens     only), is one component of a      comprehensive MRSA colonization     surveillance program. It is not     intended to diagnose MRSA     infection nor to guide or     monitor treatment for     MRSA infections.  CBC     Status: Abnormal   Collection Time    10/22/12  5:50 AM      Result Value Range   WBC 10.6 (*) 4.0 - 10.5 K/uL   RBC 5.07  4.22 - 5.81 MIL/uL   Hemoglobin 15.5  13.0 - 17.0 g/dL   HCT 16.1  09.6 - 04.5 %   MCV 83.6  78.0 - 100.0 fL   MCH 30.6  26.0 - 34.0 pg   MCHC 36.6 (*) 30.0 - 36.0 g/dL   RDW 40.9  81.1 - 91.4 %   Platelets 259  150 - 400 K/uL  CREATININE, SERUM     Status: Abnormal   Collection Time    10/22/12  5:50 AM      Result Value Range   Creatinine, Ser 1.06  0.50 - 1.35 mg/dL   GFR calc non Af Amer 85 (*) >90 mL/min   GFR calc Af Amer >90  >90 mL/min   Comment:            The eGFR has been calculated     using the CKD EPI equation.     This calculation has not been     validated in all clinical     situations.     eGFR's persistently     <90 mL/min signify     possible Chronic Kidney Disease.  TROPONIN I     Status: None   Collection Time    10/22/12  5:50 AM      Result Value Range   Troponin I <0.30  <0.30 ng/mL   Comment:            Due to the release kinetics of cTnI,     a negative result within the first hours     of the onset of symptoms does not rule out     myocardial infarction with certainty.     If myocardial infarction is still suspected,     repeat the test at appropriate intervals.  TROPONIN I     Status: None   Collection Time    10/22/12 10:26 AM      Result Value Range   Troponin I <0.30  <0.30 ng/mL   Comment:  Due to the release kinetics of cTnI,     a negative result within the first hours     of the onset of symptoms does not rule out     myocardial infarction with certainty.     If myocardial infarction is still suspected,     repeat the test at appropriate intervals.    Dg Chest 2 View  10/21/2012   *RADIOLOGY REPORT*  Clinical  Data: Elevated blood pressure.  Chest pain and headache.  CHEST - 2 VIEW  Comparison: Chest radiograph 04/20/2011  Findings: The heart, mediastinal, and hilar contours are normal. Normal pulmonary vascularity.  Midline trachea.  The lungs are clear.  There is no pleural effusion or pneumothorax.  Bony thorax is unremarkable.  IMPRESSION: No acute cardiopulmonary disease.   Original Report Authenticated By: Britta Mccreedy, M.D.   Ct Head Wo Contrast  10/21/2012   *RADIOLOGY REPORT*  Clinical Data: Hypertension and chest pain  CT HEAD WITHOUT CONTRAST  Technique:  Contiguous axial images were obtained from the base of the skull through the vertex without contrast.  Comparison: None.  Findings: The brain has a normal appearance without evidence for hemorrhage, infarction, hydrocephalus, or mass lesion.  There is no extra axial fluid collection.  The skull and paranasal sinuses are normal.  IMPRESSION: Negative exam.   Original Report Authenticated By: Signa Kell, M.D.   Ct Angio Chest Pe W/cm &/or Wo Cm  10/22/2012   *RADIOLOGY REPORT*  Clinical Data: Left upper chest pain, pleuritic.  History of asthma.  Hypertension.  CT ANGIOGRAPHY CHEST  Technique:  Multidetector CT imaging of the chest using the standard protocol during bolus administration of intravenous contrast. Multiplanar reconstructed images including MIPs were obtained and reviewed to evaluate the vascular anatomy.  Contrast: OMNIPAQUE IOHEXOL 350 MG/ML SOLN  Comparison: 10/21/2012 radiographs  Findings: No filling defect is identified in the pulmonary arterial tree to suggest pulmonary embolus.  AP window lymph node short axis 1.2 cm.  Right hilar node short axis 1.0 cm.  Prominent left infrahilar nodal tissue.  No reversal of the interventricular septal contour.  Ill-defined airspace opacities and ground-glass opacities in the right middle lobe posteriorly.  Airway plugging and thickening noted especially in the lower lobes with associated  reticulonodular interstitial opacity in the left lower lobe probably representing atypical infectious process, and patchy opacity in the lingula with suspected airway plugging.  IMPRESSION:  1.  Considerable airway plugging in the lower lobes with some airway plugging in the lingula, and reticulonodular opacities in the left lower lobe favoring atypical infectious process.  Mild adenopathy in the right hilum and AP window is likely reactive. 2.  No embolus identified.   Original Report Authenticated By: Gaylyn Rong, M.D.    Assessment/Plan 1 chest pain-patient's symptoms are not consistent with cardiac pain. They increased with cough and inspiration. Enzymes negative. His electrocardiogram shows inferior lateral T-wave inversion. This may be related to uncontrolled hypertension. He can have a stress echocardiogram after he recovers from pneumonia. Discontinue nitroglycerin. 2 hypertension-continue metoprolol. Can increase or add medications based on followup blood pressure readings. 3 tobacco abuse-patient counseled on discontinuing. 4 pneumonia-continue antibiotics.  Olga Millers MD 10/22/2012, 12:12 PM

## 2012-10-22 NOTE — Progress Notes (Signed)
  Echocardiogram 2D Echocardiogram has been performed.  Carlos Sanford 10/22/2012, 11:59 AM

## 2012-10-23 LAB — CBC
Hemoglobin: 15.3 g/dL (ref 13.0–17.0)
MCHC: 35.3 g/dL (ref 30.0–36.0)
RBC: 5.14 MIL/uL (ref 4.22–5.81)
WBC: 10.5 10*3/uL (ref 4.0–10.5)

## 2012-10-23 LAB — BASIC METABOLIC PANEL
GFR calc non Af Amer: 73 mL/min — ABNORMAL LOW (ref >90)
Glucose, Bld: 90 mg/dL (ref 70–99)
Potassium: 3.6 mEq/L (ref 3.5–5.1)
Sodium: 139 mEq/L (ref 135–145)

## 2012-10-23 LAB — LEGIONELLA ANTIGEN, URINE: Legionella Antigen, Urine: NEGATIVE

## 2012-10-23 MED ORDER — ALBUTEROL SULFATE (5 MG/ML) 0.5% IN NEBU
2.5000 mg | INHALATION_SOLUTION | Freq: Three times a day (TID) | RESPIRATORY_TRACT | Status: DC
  Administered 2012-10-24: 2.5 mg via RESPIRATORY_TRACT
  Filled 2012-10-23 (×2): qty 0.5

## 2012-10-23 MED ORDER — HYDRALAZINE HCL 20 MG/ML IJ SOLN
10.0000 mg | Freq: Four times a day (QID) | INTRAMUSCULAR | Status: DC | PRN
  Administered 2012-10-23: 10 mg via INTRAVENOUS
  Filled 2012-10-23: qty 1

## 2012-10-23 MED ORDER — ASPIRIN 81 MG PO CHEW
CHEWABLE_TABLET | ORAL | Status: AC
  Filled 2012-10-23: qty 1

## 2012-10-23 MED ORDER — ASPIRIN EC 81 MG PO TBEC
81.0000 mg | DELAYED_RELEASE_TABLET | Freq: Every day | ORAL | Status: DC
  Administered 2012-10-23 – 2012-10-24 (×2): 81 mg via ORAL
  Filled 2012-10-23 (×3): qty 1

## 2012-10-23 MED ORDER — SODIUM CHLORIDE 0.9 % IV SOLN: INTRAVENOUS | Status: DC

## 2012-10-23 MED ORDER — ALBUTEROL SULFATE (5 MG/ML) 0.5% IN NEBU: 2.5000 mg | INHALATION_SOLUTION | Freq: Four times a day (QID) | RESPIRATORY_TRACT | Status: DC | PRN

## 2012-10-23 MED ORDER — METOPROLOL TARTRATE 50 MG PO TABS
50.0000 mg | ORAL_TABLET | Freq: Two times a day (BID) | ORAL | Status: DC
  Administered 2012-10-23 – 2012-10-24 (×3): 50 mg via ORAL
  Filled 2012-10-23 (×5): qty 1

## 2012-10-23 MED ORDER — SODIUM CHLORIDE 0.9 % IV SOLN
INTRAVENOUS | Status: AC
  Administered 2012-10-23: 09:00:00 via INTRAVENOUS

## 2012-10-23 NOTE — Progress Notes (Signed)
NURSING PROGRESS NOTE  Carlos Sanford [id number] Transfer Data: 10/23/2012 2:41 PM Attending Provider: Catarina Hartshorn, MD [id number], Provider, MD Code Status: full   Carlos Sanford is a 42 y.o. male patient transferred from 2900  -No acute distress noted.  -No complaints of shortness of breath.  -No complaints of chest pain.   Blood pressure 144/98, pulse 79, temperature 98.2 F (36.8 C), temperature source Oral, resp. rate 12, height 5\' 10"  (1.778 m), weight 85 kg (187 lb 6.3 oz), SpO2 99.00%.   IV Fluids:  IV in place, occlusive dsg intact without redness, IV cath forearm right, condition patent and no redness and antecubital right, condition patent and no redness none.   Allergies:  Penicillins  Past Medical History:   has a past medical history of Asthma; Multiple allergies; and Hypertension.  Past Surgical History:   has past surgical history that includes Appendectomy.  Social History:   reports that he has been smoking.  He does not have any smokeless tobacco history on file. He reports that  drinks alcohol. He reports that he does not use illicit drugs.  Skin: intact  Patient/Family orientated to room. Information packet given to patient/family. Admission inpatient armband information verified with patient/family to include name and date of birth and placed on patient arm. Side rails up x 2, fall assessment and education completed with patient/family. Patient/family able to verbalize understanding of risk associated with falls and verbalized understanding to call for assistance before getting out of bed. Call light within reach. Patient/family able to voice and demonstrate understanding of unit orientation instructions.    Will continue to evaluate and treat per MD orders.  Madelin Rear, MSN, RN, [employer]

## 2012-10-23 NOTE — Progress Notes (Signed)
   Subjective:  Denies CP; dyspnea improving   Objective:  Filed Vitals:   10/23/12 0407 10/23/12 0630 10/23/12 0800 10/23/12 0907  BP: 150/80 145/89 140/93   Pulse: 77 65 64   Temp: 98.3 F (36.8 C)  98.1 F (36.7 C)   TempSrc: Axillary  Oral   Resp: 22 14 13    Height:      Weight:  187 lb 6.3 oz (85 kg)    SpO2: 98% 96% 96% 97%    Intake/Output from previous day:  Intake/Output Summary (Last 24 hours) at 10/23/12 0913 Last data filed at 10/23/12 1610  Gross per 24 hour  Intake   1147 ml  Output    650 ml  Net    497 ml    Physical Exam: Physical exam: Well-developed well-nourished in no acute distress.  Skin is warm and dry.  HEENT is normal.  Neck is supple.  Chest with mild exp wheeze Cardiovascular exam is regular rate and rhythm.  Abdominal exam nontender or distended. No masses palpated. Extremities show no edema. neuro grossly intact    Lab Results: Basic Metabolic Panel:  Recent Labs  96[birth year]04/[birth year] 1947 10/22/12 0550 10/23/12 0440  NA 138  --  139  K 4.2  --  3.6  CL 102  --  103  CO2 29  --  28  GLUCOSE 109*  --  90  BUN 9  --  10  CREATININE 1.10 1.06 1.20  CALCIUM 9.8  --  9.4   CBC:  Recent Labs  10/21/12 1947 10/22/12 0550 10/23/12 0440  WBC 10.4 10.6* 10.5  NEUTROABS 8.6*  --   --   HGB 16.1 15.5 15.3  HCT 44.4 42.4 43.3  MCV 84.3 83.6 84.2  PLT 268 259 258   Cardiac Enzymes:  Recent Labs  10/22/12 0550 10/22/12 1026 10/22/12 1650  TROPONINI <0.30 <0.30 <0.30     Assessment/Plan:  1 chest pain-patient's symptoms are not consistent with cardiac pain. They increased with cough and inspiration; now resolved. Enzymes negative. His electrocardiogram shows inferior lateral T-wave inversion. This may be related to uncontrolled hypertension. Plan stress echocardiogram after he recovers from pneumonia. Please schedule at [facility] office at [place] and fu with me 2 weeks later. 2 hypertension-continue metoprolol. Can increase or add  medications based on followup blood pressure readings as an outpatient with his primary care.  3 tobacco abuse-patient counseled on discontinuing.  4 pneumonia-continue antibiotics; per primary care 5 Decreased TSH - ? Euthyroid sick; further evaluation per primary care. We will sign off; please call with questions   Olga Millers 10/23/2012, 9:13 AM

## 2012-10-23 NOTE — Progress Notes (Signed)
[facility] PROGRESS NOTE  Carlos Sanford WUJ:[id number] DOB: [birth year] DOA: 10/21/2012 PCP: Default, Provider, MD  Assessment/Plan: Pneumonia, likely community-acquired  -Added ceftriaxone 10/22/12 -Continue azithromycin -Remote history of penicillin allergy, the patient states that he is taking penicillin since then  -Urine Legionella antigen  -Urine Streptococcus pneumoniae antigen-negative  -HIV test--negative Abnormal EKG  -Troponins negative x3  -T wave inversion II, III, aVF, V5, V6  -Appreciate cadiology recommendation  -Urine drug screen is negative  -Discontinued nitroglycerin drip -Transfer to medical floor Hypertension  -Will likely need to be started on antihypertensive medications during this admission--metoprolol tartrate has been start  -Continue aspirin  -Increase metoprolol tartrate to 50 mg twice a day -TSH 0.133--> check free T4 Tobacco abuse  -tobacco cessation discussed  -15-pack-year history  Asthma history  -Clinically compensated without exacerbation  -Continue albuterol every 6 hours   Family Communication: wife at beside--total time 60 minutes spent speaking with patient, wife and coordinating care  Disposition Plan: Home when medically stable    Antibiotics:  Ceftriaxone/azithromycin 10/22/2012>>>>    Procedures/Studies: Dg Chest 2 View  10/21/2012   *RADIOLOGY REPORT*  Clinical Data: Elevated blood pressure.  Chest pain and headache.  CHEST - 2 VIEW  Comparison: Chest radiograph 04/20/2011  Findings: The heart, mediastinal, and hilar contours are normal. Normal pulmonary vascularity.  Midline trachea.  The lungs are clear.  There is no pleural effusion or pneumothorax.  Bony thorax is unremarkable.  IMPRESSION: No acute cardiopulmonary disease.   Original Report Authenticated By: Britta Mccreedy, M.D.   Ct Head Wo Contrast  10/21/2012   *RADIOLOGY REPORT*  Clinical Data: Hypertension and chest pain  CT HEAD WITHOUT CONTRAST   Technique:  Contiguous axial images were obtained from the base of the skull through the vertex without contrast.  Comparison: None.  Findings: The brain has a normal appearance without evidence for hemorrhage, infarction, hydrocephalus, or mass lesion.  There is no extra axial fluid collection.  The skull and paranasal sinuses are normal.  IMPRESSION: Negative exam.   Original Report Authenticated By: Signa Kell, M.D.   Ct Angio Chest Pe W/cm &/or Wo Cm  10/22/2012   *RADIOLOGY REPORT*  Clinical Data: Left upper chest pain, pleuritic.  History of asthma.  Hypertension.  CT ANGIOGRAPHY CHEST  Technique:  Multidetector CT imaging of the chest using the standard protocol during bolus administration of intravenous contrast. Multiplanar reconstructed images including MIPs were obtained and reviewed to evaluate the vascular anatomy.  Contrast: OMNIPAQUE IOHEXOL 350 MG/ML SOLN  Comparison: 10/21/2012 radiographs  Findings: No filling defect is identified in the pulmonary arterial tree to suggest pulmonary embolus.  AP window lymph node short axis 1.2 cm.  Right hilar node short axis 1.0 cm.  Prominent left infrahilar nodal tissue.  No reversal of the interventricular septal contour.  Ill-defined airspace opacities and ground-glass opacities in the right middle lobe posteriorly.  Airway plugging and thickening noted especially in the lower lobes with associated reticulonodular interstitial opacity in the left lower lobe probably representing atypical infectious process, and patchy opacity in the lingula with suspected airway plugging.  IMPRESSION:  1.  Considerable airway plugging in the lower lobes with some airway plugging in the lingula, and reticulonodular opacities in the left lower lobe favoring atypical infectious process.  Mild adenopathy in the right hilum and AP window is likely reactive. 2.  No embolus identified.   Original Report Authenticated By: Gaylyn Rong, M.D.          Subjective: Patient is  feeling well. He denies any fevers, chills, chest pain, shortness of breath, nausea, vomiting, diarrhea. No abdominal pain or dysuria, no dizziness.  Objective: Filed Vitals:   10/23/12 0130 10/23/12 0242 10/23/12 0407 10/23/12 0630  BP: 132/75  150/80 145/89  Pulse: 78 70 77 65  Temp:   98.3 F (36.8 C)   TempSrc:   Axillary   Resp: 15 13 22 14   Height:      Weight:    85 kg (187 lb 6.3 oz)  SpO2: 96% 100% 98% 96%    Intake/Output Summary (Last 24 hours) at 10/23/12 0811 Last data filed at 10/23/12 0981  Gross per 24 hour  Intake   1156 ml  Output    650 ml  Net    506 ml   Weight change: 0.5 kg (1 lb 1.6 oz) Exam:   General:  Pt is alert, follows commands appropriately, not in acute distress  HEENT: No icterus, No thrush,  [place]/AT  Cardiovascular: RRR, S1/S2, no rubs, no gallops  Respiratory: Bibasilar rales. No wheezing. Good air movement.  Abdomen: Soft/+BS, non tender, non distended, no guarding  Extremities: No edema, No lymphangitis, No petechiae, No rashes, no synovitis  Data Reviewed: Basic Metabolic Panel:  Recent Labs Lab 10/21/12 1947 10/22/12 0550 10/23/12 0440  NA 138  --  139  K 4.2  --  3.6  CL 102  --  103  CO2 29  --  28  GLUCOSE 109*  --  90  BUN 9  --  10  CREATININE 1.10 1.06 1.20  CALCIUM 9.8  --  9.4   Liver Function Tests:  Recent Labs Lab 10/21/12 1947  AST 15  ALT 18  ALKPHOS 72  BILITOT 0.5  PROT 7.9  ALBUMIN 4.3   No results found for this basename: LIPASE, AMYLASE,  in the last 168 hours No results found for this basename: AMMONIA,  in the last 168 hours CBC:  Recent Labs Lab 10/21/12 1947 10/22/12 0550 10/23/12 0440  WBC 10.4 10.6* 10.5  NEUTROABS 8.6*  --   --   HGB 16.1 15.5 15.3  HCT 44.4 42.4 43.3  MCV 84.3 83.6 84.2  PLT 268 259 258   Cardiac Enzymes:  Recent Labs Lab 10/22/12 0550 10/22/12 1026 10/22/12 1650  TROPONINI <0.30 <0.30 <0.30   BNP: No  components found with this basename: POCBNP,  CBG: No results found for this basename: GLUCAP,  in the last 168 hours  Recent Results (from the past 240 hour(s))  MRSA PCR SCREENING     Status: None   Collection Time    10/22/12  5:13 AM      Result Value Range Status   MRSA by PCR NEGATIVE  NEGATIVE Final   Comment:            The GeneXpert MRSA Assay (FDA     approved for NASAL specimens     only), is one component of a     comprehensive MRSA colonization     surveillance program. It is not     intended to diagnose MRSA     infection nor to guide or     monitor treatment for     MRSA infections.     Scheduled Meds: . albuterol  2.5 mg Nebulization Q6H  . aspirin EC  325 mg Oral Daily  . azithromycin  500 mg Oral Daily  . cefTRIAXone (ROCEPHIN)  IV  1 g Intravenous Q24H  . enoxaparin (LOVENOX) injection  40 mg Subcutaneous Daily  . metoprolol tartrate  50 mg Oral BID  . pneumococcal 23 valent vaccine  0.5 mL Intramuscular Tomorrow-1000  . sodium chloride  3 mL Intravenous Q12H  . sodium chloride  3 mL Intravenous Q12H   Continuous Infusions: . sodium chloride       Beulah Matusek, DO  [facility] Pager [phone]  If 7PM-7AM, please contact night-coverage [email or web address] Password [email or web address] 10/23/2012, 8:11 AM   LOS: 2 days

## 2012-10-24 DIAGNOSIS — R0789 Other chest pain: Secondary | ICD-10-CM

## 2012-10-24 LAB — CBC
HCT: 44.3 % (ref 39.0–52.0)
Platelets: 243 10*3/uL (ref 150–400)
RDW: 13.4 % (ref 11.5–15.5)
WBC: 10.3 10*3/uL (ref 4.0–10.5)

## 2012-10-24 LAB — BASIC METABOLIC PANEL
GFR calc Af Amer: 81 mL/min — ABNORMAL LOW (ref >90)
GFR calc non Af Amer: 70 mL/min — ABNORMAL LOW (ref >90)
Glucose, Bld: 99 mg/dL (ref 70–99)
Potassium: 4.1 mEq/L (ref 3.5–5.1)
Sodium: 138 mEq/L (ref 135–145)

## 2012-10-24 LAB — PHOSPHORUS: Phosphorus: 3.7 mg/dL (ref 2.3–4.6)

## 2012-10-24 MED ORDER — METOPROLOL SUCCINATE ER 100 MG PO TB24: 100.0000 mg | ORAL_TABLET | Freq: Every day | ORAL | Status: DC

## 2012-10-24 MED ORDER — ALBUTEROL SULFATE HFA 108 (90 BASE) MCG/ACT IN AERS: 2.0000 | INHALATION_SPRAY | Freq: Four times a day (QID) | RESPIRATORY_TRACT | Status: DC | PRN

## 2012-10-24 MED ORDER — CEFDINIR 300 MG PO CAPS: 300.0000 mg | ORAL_CAPSULE | Freq: Two times a day (BID) | ORAL | Status: DC

## 2012-10-24 MED ORDER — AZITHROMYCIN 500 MG PO TABS: 500.0000 mg | ORAL_TABLET | Freq: Every day | ORAL | Status: DC

## 2012-10-24 NOTE — Discharge Summary (Signed)
Physician Discharge Summary  Carlos Sanford ZOX:[id number] DOB: 1971/02/12 DOA: 10/21/2012  PCP: Default, Provider, MD  Admit date: 10/21/2012 Discharge date: 10/24/2012  Recommendations for Outpatient Follow-up:  1. Pt will need to follow up with PCP in 2 weeks post discharge 2. Please obtain BMP to evaluate electrolytes and kidney function 3. Please also check CBC to evaluate Hg and Hct levels   Discharge Diagnoses:  Active Problems:   Atypical chest pain   HTN (hypertension)   Tobacco abuse   Asthma   Bronchitis   CAP (community acquired pneumonia) Pneumonia, likely community-acquired  -Added ceftriaxone 10/22/12  -Continue azithromycin  -Remote history of penicillin allergy, the patient states that he is taking penicillin since then  -Patient will go home with Omnicef 300 mg twice a day and azithromycin 500 mg daily for 4 additional days -Urine Legionella antigen--negative -Urine Streptococcus pneumoniae antigen-negative  -HIV test--negative  -CT angiogram of the chest was negative for pulmonary on this but should bilateral scattered opacities Abnormal EKG -Likely due to stress from the patient's PNA  -Troponins negative x3  -T wave inversion II, III, aVF, V5, V6  -Appreciate cadiology recommendation--they felt that the patient's EKG changes and chest discomfort may have been related to the patient's pneumonia and stress response--no further inpatient testing was necessary  -Urine drug screen is negative  -Discontinued nitroglycerin drip (patient was on drip for <24 hours) Hypertension  -started on antihypertensive medications during this admission--metoprolol tartrate has been start  -Continue aspirin  -Increase metoprolol tartrate to 50 mg twice a day  -Patient will go home with metoprolol succinate 100 mg daily -TSH 0.133--> check free T4--1.19 Tobacco abuse  -tobacco cessation discussed  -15-pack-year history -Albuterol MDI 2 puffs every 6 hours when necessary  shortness of breath Asthma history  -Clinically compensated without exacerbation  -Continue albuterol every 6 hours  Family Communication: wife at beside--total time 60 minutes spent speaking with patient, wife and coordinating care  Disposition Plan: Home when medically stable  Antibiotics:  Ceftriaxone/azithromycin 10/22/2012>>>> 10/24/2012   Discharge Condition: Stable  Disposition:  Follow-up Information   Follow up with Olga Millers, MD In 1 month.   Contact information:   [address], [address]                         [address] [place] [address] [phone]       Diet: Heart healthy Wt Readings from Last 3 Encounters:  10/23/12 85 kg (187 lb 6.3 oz)    History of present illness:  42 y.o. male with hx of asthma, HTN, noncompliance with meds, presents to the ER with severe chest pain. Pain is a little to the left of his chest. He also has some shortness of breath, a little nausea, but no vomiting. He denied any leg swelling or calf pain. He does have family hx of CAD, but could n't tell how old his parents were when they had CAD. He does smoke, but denied any drug use (specifically no cocaine use). Evalution in the ER showed hypertensive response with SBP 190. EKG showed ST depression with T waves inversion on inferolateral leads. His initial troponin was negative. He has normal renal fx tests, normal LFTs, and no leukocytosis. He admitted to having productive coughs for the past few days, and having sweats. He was started on IV NTG, and given MS, which caused some nausea. His pain came down to 3/10, and hospitalist was asked to admit him for chest  pain and HTN. CT angiogram of the chest was performed on 10/22/2012 and showed ill-defined airspace opacity in the right middle lobe with airway plugging and thickening and opacity in the left lower lobe. Because of the patient's chest discomfort, patient was placed on a nitroglycerin drip and admitted to step down unit. His troponins  were negative. Cardiology was consult at and discontinued the nitroglycerin drip.   Consultants: Cardiology  Discharge Exam: Filed Vitals:   10/24/12 0547  BP: 165/104  Pulse: 66  Temp: 98.2 F (36.8 C)  Resp: 18   Filed Vitals:   10/23/12 2046 10/23/12 2123 10/24/12 0547 10/24/12 0738  BP:  155/104 165/104   Pulse:  75 66   Temp:  99.1 F (37.3 C) 98.2 F (36.8 C)   TempSrc:  Oral Oral   Resp:  18 18   Height:      Weight:      SpO2: 97% 94% 97% 97%   General: A&O x 3, NAD, pleasant, cooperative Cardiovascular: RRR, no rub, no gallop, no S3 Respiratory: Bibasilar crackles without any wheezing or rhonchi. Good air movement. Abdomen:soft, nontender, nondistended, positive bowel sounds Extremities: No edema, No lymphangitis, no petechiae  Discharge Instructions  Discharge Orders   Future Orders Complete By Expires     Diet - low sodium heart healthy  As directed     Increase activity slowly  As directed         Medication List    TAKE these medications       albuterol 108 (90 BASE) MCG/ACT inhaler  Commonly known as:  PROVENTIL HFA;VENTOLIN HFA  Inhale 2 puffs into the lungs every 6 (six) hours as needed for wheezing.     azithromycin 500 MG tablet  Commonly known as:  ZITHROMAX  Take 1 tablet (500 mg total) by mouth daily.     cefdinir 300 MG capsule  Commonly known as:  OMNICEF  Take 1 capsule (300 mg total) by mouth 2 (two) times daily.     metoprolol succinate 100 MG 24 hr tablet  Commonly known as:  TOPROL XL  Take 1 tablet (100 mg total) by mouth daily. Take with or immediately following a meal.         The results of significant diagnostics from this hospitalization (including imaging, microbiology, ancillary and laboratory) are listed below for reference.    Significant Diagnostic Studies: Dg Chest 2 View  10/21/2012   *RADIOLOGY REPORT*  Clinical Data: Elevated blood pressure.  Chest pain and headache.  CHEST - 2 VIEW  Comparison: Chest  radiograph 04/20/2011  Findings: The heart, mediastinal, and hilar contours are normal. Normal pulmonary vascularity.  Midline trachea.  The lungs are clear.  There is no pleural effusion or pneumothorax.  Bony thorax is unremarkable.  IMPRESSION: No acute cardiopulmonary disease.   Original Report Authenticated By: Britta Mccreedy, M.D.   Ct Head Wo Contrast  10/21/2012   *RADIOLOGY REPORT*  Clinical Data: Hypertension and chest pain  CT HEAD WITHOUT CONTRAST  Technique:  Contiguous axial images were obtained from the base of the skull through the vertex without contrast.  Comparison: None.  Findings: The brain has a normal appearance without evidence for hemorrhage, infarction, hydrocephalus, or mass lesion.  There is no extra axial fluid collection.  The skull and paranasal sinuses are normal.  IMPRESSION: Negative exam.   Original Report Authenticated By: Signa Kell, M.D.   Ct Angio Chest Pe W/cm &/or Wo Cm  10/22/2012   *RADIOLOGY REPORT*  Clinical Data: Left upper chest pain, pleuritic.  History of asthma.  Hypertension.  CT ANGIOGRAPHY CHEST  Technique:  Multidetector CT imaging of the chest using the standard protocol during bolus administration of intravenous contrast. Multiplanar reconstructed images including MIPs were obtained and reviewed to evaluate the vascular anatomy.  Contrast: OMNIPAQUE IOHEXOL 350 MG/ML SOLN  Comparison: 10/21/2012 radiographs  Findings: No filling defect is identified in the pulmonary arterial tree to suggest pulmonary embolus.  AP window lymph node short axis 1.2 cm.  Right hilar node short axis 1.0 cm.  Prominent left infrahilar nodal tissue.  No reversal of the interventricular septal contour.  Ill-defined airspace opacities and ground-glass opacities in the right middle lobe posteriorly.  Airway plugging and thickening noted especially in the lower lobes with associated reticulonodular interstitial opacity in the left lower lobe probably representing atypical  infectious process, and patchy opacity in the lingula with suspected airway plugging.  IMPRESSION:  1.  Considerable airway plugging in the lower lobes with some airway plugging in the lingula, and reticulonodular opacities in the left lower lobe favoring atypical infectious process.  Mild adenopathy in the right hilum and AP window is likely reactive. 2.  No embolus identified.   Original Report Authenticated By: Gaylyn Rong, M.D.     Microbiology: Recent Results (from the past 240 hour(s))  MRSA PCR SCREENING     Status: None   Collection Time    10/22/12  5:13 AM      Result Value Range Status   MRSA by PCR NEGATIVE  NEGATIVE Final   Comment:            The GeneXpert MRSA Assay (FDA     approved for NASAL specimens     only), is one component of a     comprehensive MRSA colonization     surveillance program. It is not     intended to diagnose MRSA     infection nor to guide or     monitor treatment for     MRSA infections.     Labs: Basic Metabolic Panel:  Recent Labs Lab 10/21/12 1947 10/22/12 0550 10/23/12 0440 10/24/12 0450  NA 138  --  139 138  K 4.2  --  3.6 4.1  CL 102  --  103 101  CO2 29  --  28 27  GLUCOSE 109*  --  90 99  BUN 9  --  10 11  CREATININE 1.10 1.06 1.20 1.24  CALCIUM 9.8  --  9.4 9.4  MG  --   --   --  2.0  PHOS  --   --   --  3.7   Liver Function Tests:  Recent Labs Lab 10/21/12 1947  AST 15  ALT 18  ALKPHOS 72  BILITOT 0.5  PROT 7.9  ALBUMIN 4.3   No results found for this basename: LIPASE, AMYLASE,  in the last 168 hours No results found for this basename: AMMONIA,  in the last 168 hours CBC:  Recent Labs Lab 10/21/12 1947 10/22/12 0550 10/23/12 0440 10/24/12 0450  WBC 10.4 10.6* 10.5 10.3  NEUTROABS 8.6*  --   --   --   HGB 16.1 15.5 15.3 15.3  HCT 44.4 42.4 43.3 44.3  MCV 84.3 83.6 84.2 85.2  PLT 268 259 258 243   Cardiac Enzymes:  Recent Labs Lab 10/22/12 0550 10/22/12 1026 10/22/12 1650  TROPONINI  <0.30 <0.30 <0.30   BNP: No components found with this basename: POCBNP,  CBG: No results found for this basename: GLUCAP,  in the last 168 hours  Time coordinating discharge:  Greater than 30 minutes  Signed:  Rally Ouch, DO [facility] Pager: [phone] 10/24/2012, 11:33 AM

## 2012-10-24 NOTE — Care Management Note (Addendum)
    Page 1 of 1   10/24/2012     12:23:24 PM   CARE MANAGEMENT NOTE 10/24/2012  Patient:  Sawin,Reyaansh   Account Number:  [id number]  Date Initiated:  10/24/2012  Documentation initiated by:  Letha Cape  Subjective/Objective Assessment:   dx bronchitis  admit- lives with spouse.  pta indep.     Action/Plan:   Anticipated DC Date:  10/24/2012   Anticipated DC Plan:  HOME/SELF CARE      DC Planning Services  CM consult  MATCH Program      Choice offered to / List presented to:             Status of service:  Completed, signed off Medicare Important Message given?   (If response is "NO", the following Medicare IM given date fields will be blank) Date Medicare IM given:   Date Additional Medicare IM given:    Discharge Disposition:  HOME/SELF CARE  Per UR Regulation:  Reviewed for med. necessity/level of care/duration of stay  If discussed at Long Length of Stay Meetings, dates discussed:    Comments:  10/24/12 12:09 Letha Cape RN, BSN [phone] patient lives with spouse, pta indep.  Patient has transportation, but states he needs ast with medicatrions , informed patient this is a one time only assist, he understood, asst patient with Match Program.  MD sent patient scripts to [facility] on [address] , they are a participating pharmacy with the [employer].

## 2012-10-24 NOTE — Progress Notes (Signed)
NURSING PROGRESS NOTE  Carlos Sanford [id number] Discharge Data: 10/24/2012 11:18 AM Attending Provider: Catarina Hartshorn, MD [id number], Provider, MD     Antony Contras to be D/C'd Home per MD order.  Discussed with the patient the After Visit Summary and all questions fully answered. All IV's discontinued with no bleeding noted. All belongings returned to patient for patient to take home.   Last Vital Signs:  Blood pressure 165/104, pulse 66, temperature 98.2 F (36.8 C), temperature source Oral, resp. rate 18, height 5\' 10"  (1.778 m), weight 85 kg (187 lb 6.3 oz), SpO2 97.00%.  Discharge Medication List   Medication List     As of 10/24/2012 11:18 AM    Notice      You have not been prescribed any medications.

## 2012-12-29 ENCOUNTER — Other Ambulatory Visit (HOSPITAL_COMMUNITY): Payer: Self-pay | Admitting: Cardiology

## 2013-01-02 ENCOUNTER — Ambulatory Visit (INDEPENDENT_AMBULATORY_CARE_PROVIDER_SITE_OTHER): Payer: Self-pay | Admitting: Cardiology

## 2013-01-02 ENCOUNTER — Encounter: Payer: Self-pay | Admitting: Cardiology

## 2013-01-02 VITALS — BP 174/119 | HR 76 | Wt 194.0 lb

## 2013-01-02 DIAGNOSIS — R079 Chest pain, unspecified: Secondary | ICD-10-CM

## 2013-01-02 MED ORDER — METOPROLOL SUCCINATE ER 100 MG PO TB24: 100.0000 mg | ORAL_TABLET | Freq: Every day | ORAL | Status: DC

## 2013-01-02 MED ORDER — ALBUTEROL SULFATE HFA 108 (90 BASE) MCG/ACT IN AERS: 2.0000 | INHALATION_SPRAY | Freq: Four times a day (QID) | RESPIRATORY_TRACT | Status: DC | PRN

## 2013-01-02 NOTE — Progress Notes (Signed)
   HPI: 42 year old male for followup of chest pain. Patient admitted in may of 2014 with pneumonia. His electrocardiogram was abnormal and he describes chest pain. Enzymes negative. CT in May 2014 showed no pulmonary embolus. There was felt to be probable atypical infectious process. I was consulted and felt his pain was not cardiac. I felt his electrocardiographic changes were most consistent with uncontrolled hypertension. Echocardiogram in May 2014 showed normal LV function and grade 2 diastolic dysfunction. Since DC, the patient has dyspnea with more extreme activities but not with routine activities. It is relieved with rest. It is not associated with chest pain. There is no orthopnea, PND or pedal edema. There is no syncope or palpitations. There is no exertional chest pain.   Current Outpatient Prescriptions  Medication Sig Dispense Refill  . albuterol (PROVENTIL HFA;VENTOLIN HFA) 108 (90 BASE) MCG/ACT inhaler Inhale 2 puffs into the lungs every 6 (six) hours as needed for wheezing.  1 Inhaler  1  . metoprolol succinate (TOPROL-XL) 100 MG 24 hr tablet TAKE 1 TABLET BY MOUTH DAILY. TAKE WITH OR IMMEDIATELY FOLLOWING A MEAL  30 tablet  0   No current facility-administered medications for this visit.     Past Medical History  Diagnosis Date  . Asthma   . Multiple allergies   . Hypertension     Past Surgical History  Procedure Laterality Date  . Appendectomy      History   Social History  . Marital Status: Single    Spouse Name: N/A    Number of Children: 4  . Years of Education: N/A   Occupational History  .      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 Smoking status: Current Every Day Smoker  . Smokeless tobacco: Not on file  . Alcohol Use: Yes     Comment: occasionally  . Drug Use: No  . Sexually Active: Not on file   Other Topics Concern  . Not on file   Social History Narrative  . No narrative on file    ROS: no fevers or chills, productive cough,  hemoptysis, dysphasia, odynophagia, melena, hematochezia, dysuria, hematuria, rash, seizure activity, orthopnea, PND, pedal edema, claudication. Remaining systems are negative.  Physical Exam: Well-developed well-nourished in no acute distress.  Skin is warm and dry.  HEENT is normal.  Neck is supple.  Chest is clear to auscultation with normal expansion.  Cardiovascular exam is regular rate and rhythm.  Abdominal exam nontender or distended. No masses palpated. Extremities show no edema. neuro grossly intact  ECG 10/21/2012-sinus rhythm with inferior lateral T-wave inversion.

## 2013-01-02 NOTE — Assessment & Plan Note (Signed)
Also with abnormal electrocardiogram. Schedule stress echocardiogram for risk stratification.

## 2013-01-02 NOTE — Assessment & Plan Note (Signed)
Patient has discontinued. 

## 2013-01-02 NOTE — Assessment & Plan Note (Signed)
We have provided a prescription for when necessary albuterol.

## 2013-01-02 NOTE — Assessment & Plan Note (Addendum)
Patient's blood pressure is elevated today. On recheck it was 170/110. He has not taken his medications in several days. I have asked him to resume his Toprol at 100 mg daily. He will monitor his blood pressure at home and keep records. We will adjust his regimen as indicated. We discussed the potential issues with uncontrolled hypertension including stroke, myocardial infarction and renal failure. We also discussed lifestyle modification including low sodium diet. We will also arrange a primary care physician.

## 2013-01-02 NOTE — Patient Instructions (Addendum)
Your physician recommends that you schedule a follow-up appointment in: 8 WEEKS WITH DR CRENSHAW  RESTART METOPROLOL 100 MG ONCE DAILY  Your physician has requested that you have a stress echocardiogram. For further information please visit [email or web address] Please follow instruction sheet as given.   REFERRAL TO PRIMARY CARE MD

## 2013-01-06 ENCOUNTER — Telehealth: Payer: Self-pay | Admitting: Cardiology

## 2013-01-06 NOTE — Telephone Encounter (Signed)
01/06/13 called pt to conf 8/21 @ 3pm appointment for Stress Echo. Pt said OK  Will be there.    ob

## 2013-01-10 ENCOUNTER — Ambulatory Visit: Payer: Self-pay

## 2013-01-24 ENCOUNTER — Other Ambulatory Visit (HOSPITAL_COMMUNITY): Payer: Self-pay

## 2013-01-26 ENCOUNTER — Telehealth (HOSPITAL_COMMUNITY): Payer: Self-pay

## 2013-01-26 ENCOUNTER — Other Ambulatory Visit (HOSPITAL_COMMUNITY): Payer: Self-pay

## 2013-01-26 NOTE — Telephone Encounter (Signed)
Carlos Sanford, DOB 07/15/2070 was called yesterday to give instructions for a Stress Echo. He was told to take his Toprol as usual for test due to recent office visit with elevated BP. The patient called today with a BP reading of 157/108, no symptoms of dizziness of headache per Aida Raider. The Stress Echo was cancelled. Discussed BP reading with Dr. Jens Som with order for the patient to keep a log of his BP, and call results to Dr. Ludwig Clarks office to evaluate if needs change in medications, and reschedule of Stress Echo.Irean Hong, RN.

## 2013-02-01 ENCOUNTER — Other Ambulatory Visit (HOSPITAL_COMMUNITY): Payer: Self-pay | Admitting: Cardiology

## 2013-03-07 ENCOUNTER — Ambulatory Visit (INDEPENDENT_AMBULATORY_CARE_PROVIDER_SITE_OTHER): Payer: Self-pay | Admitting: Cardiology

## 2013-03-07 ENCOUNTER — Encounter: Payer: Self-pay | Admitting: Cardiology

## 2013-03-07 VITALS — BP 175/110 | HR 65 | Ht 70.0 in | Wt 193.4 lb

## 2013-03-07 DIAGNOSIS — I10 Essential (primary) hypertension: Secondary | ICD-10-CM

## 2013-03-07 MED ORDER — METOPROLOL SUCCINATE ER 100 MG PO TB24: ORAL_TABLET | ORAL | Status: DC

## 2013-03-07 MED ORDER — AMLODIPINE BESYLATE 5 MG PO TABS: 5.0000 mg | ORAL_TABLET | Freq: Every day | ORAL | Status: DC

## 2013-03-07 NOTE — Progress Notes (Signed)
   HPI: FU chest pain. Patient admitted in May of 2014 with pneumonia. His electrocardiogram was abnormal and he described chest pain. Enzymes negative. CT in May 2014 showed no pulmonary embolus. There was felt to be probable atypical infectious process. I was consulted and felt his pain was not cardiac. I felt his electrocardiographic changes were most consistent with uncontrolled hypertension. Echocardiogram in May 2014 showed normal LV function and grade 2 diastolic dysfunction. Stress echocardiogram ordered at last visit but not performed. Since he was last seen, the patient denies any dyspnea on exertion, orthopnea, PND, pedal edema, palpitations, syncope or chest pain.    Current Outpatient Prescriptions  Medication Sig Dispense Refill  . albuterol (PROVENTIL HFA;VENTOLIN HFA) 108 (90 BASE) MCG/ACT inhaler Inhale 2 puffs into the lungs every 6 (six) hours as needed for wheezing.  1 Inhaler  1  . metoprolol succinate (TOPROL-XL) 100 MG 24 hr tablet TAKE ONE TABLET BY MOUTH DAILY. TAKE WITH OR IMMEDIATELY FOLLOWING A MEAL  30 tablet  3   No current facility-administered medications for this visit.     Past Medical History  Diagnosis Date  . Asthma   . Multiple allergies   . Hypertension     Past Surgical History  Procedure Laterality Date  . Appendectomy      History   Social History  . Marital Status: Single    Spouse Name: N/A    Number of Children: 4  . Years of Education: N/A   Occupational History  .      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 Smoking status: Current Every Day Smoker  . Smokeless tobacco: Not on file  . Alcohol Use: Yes     Comment: occasionally  . Drug Use: No  . Sexual Activity: Not on file   Other Topics Concern  . Not on file   Social History Narrative  . No narrative on file    ROS: no fevers or chills, productive cough, hemoptysis, dysphasia, odynophagia, melena, hematochezia, dysuria, hematuria, rash, seizure activity,  orthopnea, PND, pedal edema, claudication. Remaining systems are negative.  Physical Exam: Well-developed well-nourished in no acute distress.  Skin is warm and dry.  HEENT is normal.  Neck is supple.  Chest is clear to auscultation with normal expansion.  Cardiovascular exam is regular rate and rhythm.  Abdominal exam nontender or distended. No masses palpated. Extremities show no edema. neuro grossly intact  ECG sinus rhythm with occasional PVC. Inferior lateral T-wave inversion.

## 2013-03-07 NOTE — Assessment & Plan Note (Signed)
Blood pressure remains elevated. Increase Toprol to 150 mg daily. Add Norvasc 5 mg daily. He will return to see one of our physician's assistants in 2 weeks for further adjustment of his regimen. He will continue to track his blood pressure at home and keep records. Continue lifestyle modification including low sodium diet. If his blood pressure continues to be difficult despite additional medications it would be worth pursuing renal Dopplers.

## 2013-03-07 NOTE — Assessment & Plan Note (Signed)
We will eventually schedule a stress echocardiogram but we need to control his blood pressure prior to this.

## 2013-03-07 NOTE — Patient Instructions (Addendum)
Your physician recommends that you schedule a follow-up appointment in: 2 WEEKS WITH EXTENDER FOR F/U BLOOD PRESSURE  Your physician recommends that you schedule a follow-up appointment in: 12 WEEKS WITH DR CRENSHAW  INCREASE METOPROLOL TO ONE AND ONE HALF TABLETS ONCE DAILY (150 mg)  START AMLODIPINE 5 MG ONCE DAILY

## 2013-03-21 ENCOUNTER — Encounter: Payer: Self-pay | Admitting: Physician Assistant

## 2013-03-21 ENCOUNTER — Ambulatory Visit (INDEPENDENT_AMBULATORY_CARE_PROVIDER_SITE_OTHER): Payer: Self-pay | Admitting: Physician Assistant

## 2013-03-21 VITALS — BP 170/106 | HR 71 | Ht 70.0 in | Wt 194.0 lb

## 2013-03-21 DIAGNOSIS — F17200 Nicotine dependence, unspecified, uncomplicated: Secondary | ICD-10-CM

## 2013-03-21 DIAGNOSIS — Z720 Tobacco use: Secondary | ICD-10-CM

## 2013-03-21 DIAGNOSIS — R079 Chest pain, unspecified: Secondary | ICD-10-CM

## 2013-03-21 DIAGNOSIS — I10 Essential (primary) hypertension: Secondary | ICD-10-CM

## 2013-03-21 MED ORDER — ALBUTEROL SULFATE HFA 108 (90 BASE) MCG/ACT IN AERS: 2.0000 | INHALATION_SPRAY | Freq: Four times a day (QID) | RESPIRATORY_TRACT | Status: DC | PRN

## 2013-03-21 MED ORDER — AMLODIPINE BESYLATE 10 MG PO TABS: 10.0000 mg | ORAL_TABLET | Freq: Every day | ORAL | Status: DC

## 2013-03-21 NOTE — Patient Instructions (Signed)
INCREASE NORVASC TO 10 MG DAILY; NEW RX WAS SENT IN TODAY  PLEASE REFER TO PRIMARY CARE WITH [place]  PLEASE FOLLOW UP WITH SCOTT WEAVER, PAC  A 1 TIME REFILL HAS BEEN SENT IN FOR ALBUTEROL; FURTHER REFILLS WITH YOUR PRIMARY CARE PHYSICIAN

## 2013-03-21 NOTE — Progress Notes (Signed)
[address] [place], [place]  [address] Phone: [phone] Fax:  [phone]  Date:  03/21/2013   ID:  Carlos Sanford, DOB 16-Aug-1970, MRN [id number]  PCP:  Default, Provider, MD  Cardiologist:  Dr. Olga Millers     History of Present Illness: Carlos Sanford is a 42 y.o. male who returns for followup.  Patient admitted in May of 2014 with pneumonia. His electrocardiogram was abnormal and he described chest pain. Enzymes negative. CT in May 2014 showed no pulmonary embolus. There was felt to be probable atypical infectious process. Cardiology was consulted and it was felt his pain was not cardiac. It was felt that his electrocardiographic changes were most consistent with uncontrolled hypertension. Echocardiogram in May 2014 showed normal LV function and grade 2 diastolic dysfunction. Stress echocardiogram ordered but not performed.  Last seen by Dr. Jens Som on 03/07/13. Blood pressure was markedly uncontrolled. Medications were adjusted and he was brought back today for further medication adjustments.  He ran out of Toprol 2 days ago.  The patient denies chest pain, shortness of breath, syncope, orthopnea, PND or significant pedal edema.  He denies headaches.  Labs (5/14):   K 4.2, creatinine 1.1, ALT 18, Hgb 15.3, TSH 0.133, free T4 1.19   Wt Readings from Last 3 Encounters:  03/21/13 194 lb (87.998 kg)  03/07/13 193 lb 6.4 oz (87.726 kg)  01/02/13 194 lb (87.998 kg)     Past Medical History  Diagnosis Date  . Asthma   . Multiple allergies   . Hypertension     Current Outpatient Prescriptions  Medication Sig Dispense Refill  . amLODipine (NORVASC) 5 MG tablet Take 1 tablet (5 mg total) by mouth daily.  30 tablet  11  . metoprolol succinate (TOPROL-XL) 100 MG 24 hr tablet One and one half tablets once daily Take with or immediately following a meal.  45 tablet  12  . albuterol (PROVENTIL HFA;VENTOLIN HFA) 108 (90 BASE) MCG/ACT inhaler Inhale 2 puffs  into the lungs every 6 (six) hours as needed for wheezing.  1 Inhaler  1   No current facility-administered medications for this visit.    Allergies:    Allergies  Allergen Reactions  . Penicillins Itching    When he was a kid    Social History:  The patient  reports that he has been smoking.  He does not have any smokeless tobacco history on file. He reports that he drinks alcohol. He reports that he does not use illicit drugs.   Family History:  The patient's family history includes CAD in his mother.   ROS:  Please see the history of present illness.      All other systems reviewed and negative.   PHYSICAL EXAM: VS:  BP 170/106  Pulse 71  Ht 5\' 10"  (1.778 m)  Wt 194 lb (87.998 kg)  BMI 27.84 kg/m2 Well nourished, well developed, in no acute distress HEENT: normal Neck: no JVD Cardiac:  normal S1, S2; RRR; no murmur Lungs:  clear to auscultation bilaterally, no wheezing, rhonchi or rales Abd: soft, nontender, no hepatomegaly Ext: no edema Skin: warm and dry Neuro:  CNs 2-12 intact, no focal abnormalities noted  EKG:  NSR, HR 71, normal axis, inferolateral T wave inversions, no change from prior tracing     ASSESSMENT AND PLAN:  1. Hypertension: Remains uncontrolled. He has been out of his beta blocker for 2 days. He does tell me that his blood pressure was in the 150s  over 110 range when taking both Norvasc and Toprol. He will get his Toprol refilled today. I will increase his Norvasc to 10 mg daily.  Follow up with me in 3 weeks. If his blood pressure remains uncontrolled, I will set him up for renal arterial Dopplers. Consider adding ACE inhibitor at next visit. 2. Chest Pain:  Atypical. No significant recurrence. He will eventually be set up for a stress echo once his blood pressure is better controlled. 3. Tobacco abuse: He has been advised to quit. 4. Asthma: I have refilled his albuterol. I will refer him to primary care. 5. Disposition: Followup with me in 3  weeks.  Signed, Tereso Newcomer, PA-C  03/21/2013 12:14 PM

## 2013-04-11 ENCOUNTER — Encounter (INDEPENDENT_AMBULATORY_CARE_PROVIDER_SITE_OTHER): Payer: Self-pay

## 2013-04-11 ENCOUNTER — Encounter: Payer: Self-pay | Admitting: Physician Assistant

## 2013-04-11 ENCOUNTER — Ambulatory Visit (INDEPENDENT_AMBULATORY_CARE_PROVIDER_SITE_OTHER): Payer: Self-pay | Admitting: Physician Assistant

## 2013-04-11 VITALS — BP 144/110 | HR 61 | Ht 70.0 in | Wt 194.0 lb

## 2013-04-11 DIAGNOSIS — R0789 Other chest pain: Secondary | ICD-10-CM

## 2013-04-11 DIAGNOSIS — F17200 Nicotine dependence, unspecified, uncomplicated: Secondary | ICD-10-CM

## 2013-04-11 DIAGNOSIS — I10 Essential (primary) hypertension: Secondary | ICD-10-CM

## 2013-04-11 DIAGNOSIS — Z720 Tobacco use: Secondary | ICD-10-CM

## 2013-04-11 MED ORDER — AMLODIPINE BESYLATE 10 MG PO TABS: 10.0000 mg | ORAL_TABLET | Freq: Every day | ORAL | Status: DC

## 2013-04-11 NOTE — Progress Notes (Signed)
[address] [place], [place]  [address] Phone: [phone] Fax:  [phone]  Date:  04/11/2013   ID:  ADVIK WEATHERSPOON, DOB 11/19/1970, MRN [id number]  PCP:  Default, Provider, MD  Cardiologist:  Dr. Olga Millers     History of Present Illness: Carlos Sanford is a 42 y.o. male who was admitted in May of 2014 with pneumonia. His electrocardiogram was abnormal and he had chest pain. Enzymes were negative. CT in May 2014 showed no pulmonary embolus. There was felt to be a probable atypical infectious process. Cardiology was consulted and it was felt his pain was not cardiac. It was felt that his electrocardiographic changes were most consistent with uncontrolled hypertension. Echocardiogram in May 2014 showed normal LV function and grade 2 diastolic dysfunction. Stress echocardiogram ordered but not performed. He has been seen recently for management of his HTN.    He is doing well.  He ran out of his Amlodipine 2 days ago.  The patient denies chest pain, shortness of breath, syncope, orthopnea, PND or significant pedal edema.   Recent Labs: 10/21/2012: ALT 18  10/22/2012: TSH 0.133*  10/24/2012: Creatinine 1.24; Hemoglobin 15.3; Potassium 4.1   Wt Readings from Last 3 Encounters:  04/11/13 194 lb (87.998 kg)  03/21/13 194 lb (87.998 kg)  03/07/13 193 lb 6.4 oz (87.726 kg)     Past Medical History  Diagnosis Date  . Asthma   . Multiple allergies   . Hypertension     Current Outpatient Prescriptions  Medication Sig Dispense Refill  . albuterol (PROVENTIL HFA;VENTOLIN HFA) 108 (90 BASE) MCG/ACT inhaler Inhale 2 puffs into the lungs every 6 (six) hours as needed for wheezing.  1 Inhaler  0  . amLODipine (NORVASC) 10 MG tablet Take 10 mg by mouth 2 (two) times daily.      . metoprolol succinate (TOPROL-XL) 100 MG 24 hr tablet One and one half tablets once daily Take with or immediately following a meal.  45 tablet  12   No current facility-administered  medications for this visit.    Allergies:   Penicillins   Social History:  The patient  reports that he has been smoking.  He does not have any smokeless tobacco history on file. He reports that he drinks alcohol. He reports that he does not use illicit drugs.   Family History:  The patient's family history includes CAD in his mother.   ROS:  Please see the history of present illness.   All other systems reviewed and negative.   PHYSICAL EXAM: VS:  BP 144/110  Pulse 61  Ht 5\' 10"  (1.778 m)  Wt 194 lb (87.998 kg)  BMI 27.84 kg/m2 Well nourished, well developed, in no acute distress HEENT: normal Neck: no JVD Cardiac:  normal S1, S2; RRR; no murmur Lungs:  clear to auscultation bilaterally, no wheezing, rhonchi or rales Abd: soft, nontender, no hepatomegaly Ext: no edema Skin: warm and dry Neuro:  CNs 2-12 intact, no focal abnormalities noted  EKG:  NSR, HR 61, inf-lat TWI, no change from prior tracing     ASSESSMENT AND PLAN:  1. Hypertension:  Uncontrolled.  He ran out of his Amlodipine 2 days ago.  I will refill this now.  He will come back in 2 weeks to see the RN for a BP check.  He will monitor his BPs at home and bring his machine with him to be checked.  If BP uncontrolled at the RN visit, I will  add ACEI. 2. Chest Pain:  No recurrence.  Consider arranging [facility]-Echo once BP controlled.  3. Tobacco Abuse:  He is trying to quit. 4. Disposition:  F/u with Dr. Olga Millers as planned in December.   Signed, Tereso Newcomer, PA-C  04/11/2013 2:25 PM

## 2013-04-11 NOTE — Patient Instructions (Addendum)
Check Blood Pressure once a day and record it.  Schedule appointment with the nurse in 2 weeks for a blood pressure check.  Bring your list of BP readings with you and your BP machine to be checked. BLOOD PRESSURE CHECK TO BE DONE IN 2 WEEKS; MAKE SURE TO BRING YOUR BLOOD PRESSURE MACHINE FROM HOME TO THE APPT.  Make sure you take both Metoprolol and Amlodipine the morning that you come in for the BP check.  Keep your appointment with Dr. Olga Millers on 05/31/2013.

## 2013-04-27 ENCOUNTER — Ambulatory Visit: Payer: Self-pay | Admitting: Internal Medicine

## 2013-04-27 ENCOUNTER — Ambulatory Visit: Payer: No Typology Code available for payment source | Attending: Internal Medicine

## 2013-05-19 ENCOUNTER — Ambulatory Visit: Payer: Self-pay | Admitting: Internal Medicine

## 2013-05-31 ENCOUNTER — Encounter: Payer: Self-pay | Admitting: Cardiology

## 2013-05-31 ENCOUNTER — Encounter: Payer: Self-pay | Admitting: *Deleted

## 2013-05-31 ENCOUNTER — Ambulatory Visit (INDEPENDENT_AMBULATORY_CARE_PROVIDER_SITE_OTHER): Payer: Self-pay | Admitting: Cardiology

## 2013-05-31 VITALS — BP 136/72 | HR 60 | Ht 70.0 in | Wt 193.0 lb

## 2013-05-31 DIAGNOSIS — R9431 Abnormal electrocardiogram [ECG] [EKG]: Secondary | ICD-10-CM | POA: Insufficient documentation

## 2013-05-31 DIAGNOSIS — I10 Essential (primary) hypertension: Secondary | ICD-10-CM

## 2013-05-31 MED ORDER — METOPROLOL SUCCINATE ER 100 MG PO TB24: ORAL_TABLET | ORAL | Status: DC

## 2013-05-31 MED ORDER — AMLODIPINE BESYLATE 10 MG PO TABS: 10.0000 mg | ORAL_TABLET | Freq: Every day | ORAL | Status: DC

## 2013-05-31 NOTE — Progress Notes (Signed)
      HPI: FU Hypertension; admitted in May of 2014 with pneumonia. His electrocardiogram was abnormal and he had chest pain. Enzymes were negative. CT in May 2014 showed no pulmonary embolus. There was felt to be a probable atypical infectious process. Cardiology was consulted and it was felt his pain was not cardiac. It was felt that his electrocardiographic changes were most consistent with uncontrolled hypertension. Echocardiogram in May 2014 showed normal LV function and grade 2 diastolic dysfunction. Stress echocardiogram ordered but not performed. He has been seen recently for management of his HTN. Last seen in Nov 2014. Since then, the patient denies any dyspnea on exertion, orthopnea, PND, pedal edema, palpitations, syncope or chest pain.    Current Outpatient Prescriptions  Medication Sig Dispense Refill  . albuterol (PROVENTIL HFA;VENTOLIN HFA) 108 (90 BASE) MCG/ACT inhaler Inhale 2 puffs into the lungs every 6 (six) hours as needed for wheezing.  1 Inhaler  0  . amLODipine (NORVASC) 10 MG tablet Take 1 tablet (10 mg total) by mouth daily.  30 tablet  11  . metoprolol succinate (TOPROL-XL) 100 MG 24 hr tablet One and one half tablets once daily Take with or immediately following a meal.  45 tablet  12   No current facility-administered medications for this visit.     Past Medical History  Diagnosis Date  . Asthma   . Multiple allergies   . Hypertension     Past Surgical History  Procedure Laterality Date  . Appendectomy      History   Social History  . Marital Status: Single    Spouse Name: N/A    Number of Children: 4  . Years of Education: N/A   Occupational History  .      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 Smoking status: Current Every Day Smoker  . Smokeless tobacco: Not on file  . Alcohol Use: Yes     Comment: occasionally  . Drug Use: No  . Sexual Activity: Not on file   Other Topics Concern  . Not on file   Social History  Narrative  . No narrative on file    ROS: no fevers or chills, productive cough, hemoptysis, dysphasia, odynophagia, melena, hematochezia, dysuria, hematuria, rash, seizure activity, orthopnea, PND, pedal edema, claudication. Remaining systems are negative.  Physical Exam: Well-developed well-nourished in no acute distress.  Skin is warm and dry.  HEENT is normal.  Neck is supple.  Chest is clear to auscultation with normal expansion.  Cardiovascular exam is regular rate and rhythm.  Abdominal exam nontender or distended. No masses palpated. Extremities show no edema. neuro grossly intact

## 2013-05-31 NOTE — Assessment & Plan Note (Signed)
Schedule stress-echocardiogram.

## 2013-05-31 NOTE — Assessment & Plan Note (Signed)
Blood pressure is now controlled. Continue present medications. 

## 2013-05-31 NOTE — Assessment & Plan Note (Signed)
Patient counseled on discontinuing. 

## 2013-05-31 NOTE — Patient Instructions (Signed)
Your physician wants you to follow-up in: 6 MONTHS WITH DR Jens Som You will receive a reminder letter in the mail two months in advance. If you don't receive a letter, please call our office to schedule the follow-up appointment.   Your physician has requested that you have a stress echocardiogram. For further information please visit [email or web address] Please follow instruction sheet as given.

## 2013-06-23 ENCOUNTER — Other Ambulatory Visit (HOSPITAL_COMMUNITY): Payer: Self-pay

## 2013-06-28 ENCOUNTER — Encounter (HOSPITAL_COMMUNITY): Payer: Self-pay | Admitting: Cardiology

## 2013-06-29 ENCOUNTER — Encounter (HOSPITAL_COMMUNITY): Payer: Self-pay | Admitting: Cardiology

## 2014-02-13 ENCOUNTER — Encounter (HOSPITAL_COMMUNITY): Payer: Self-pay | Admitting: Emergency Medicine

## 2014-02-13 ENCOUNTER — Emergency Department (HOSPITAL_COMMUNITY): Payer: No Typology Code available for payment source

## 2014-02-13 ENCOUNTER — Emergency Department (HOSPITAL_COMMUNITY)
Admission: EM | Admit: 2014-02-13 | Discharge: 2014-02-13 | Disposition: A | Discharge status: Home / Self Care | Payer: No Typology Code available for payment source | Attending: Emergency Medicine | Admitting: Emergency Medicine

## 2014-02-13 DIAGNOSIS — S161XXA Strain of muscle, fascia and tendon at neck level, initial encounter: Secondary | ICD-10-CM

## 2014-02-13 DIAGNOSIS — S199XXA Unspecified injury of neck, initial encounter: Secondary | ICD-10-CM

## 2014-02-13 DIAGNOSIS — J45909 Unspecified asthma, uncomplicated: Secondary | ICD-10-CM | POA: Insufficient documentation

## 2014-02-13 DIAGNOSIS — Y9389 Activity, other specified: Secondary | ICD-10-CM | POA: Insufficient documentation

## 2014-02-13 DIAGNOSIS — S0993XA Unspecified injury of face, initial encounter: Secondary | ICD-10-CM | POA: Insufficient documentation

## 2014-02-13 DIAGNOSIS — F17200 Nicotine dependence, unspecified, uncomplicated: Secondary | ICD-10-CM | POA: Insufficient documentation

## 2014-02-13 DIAGNOSIS — Z79899 Other long term (current) drug therapy: Secondary | ICD-10-CM | POA: Insufficient documentation

## 2014-02-13 DIAGNOSIS — Y92410 Unspecified street and highway as the place of occurrence of the external cause: Secondary | ICD-10-CM | POA: Insufficient documentation

## 2014-02-13 DIAGNOSIS — S139XXA Sprain of joints and ligaments of unspecified parts of neck, initial encounter: Secondary | ICD-10-CM | POA: Insufficient documentation

## 2014-02-13 DIAGNOSIS — I10 Essential (primary) hypertension: Secondary | ICD-10-CM | POA: Insufficient documentation

## 2014-02-13 DIAGNOSIS — IMO0002 Reserved for concepts with insufficient information to code with codable children: Secondary | ICD-10-CM | POA: Insufficient documentation

## 2014-02-13 DIAGNOSIS — Z791 Long term (current) use of non-steroidal anti-inflammatories (NSAID): Secondary | ICD-10-CM | POA: Insufficient documentation

## 2014-02-13 MED ORDER — CYCLOBENZAPRINE HCL 10 MG PO TABS
5.0000 mg | ORAL_TABLET | Freq: Once | ORAL | Status: AC
  Administered 2014-02-13: 5 mg via ORAL
  Filled 2014-02-13: qty 1

## 2014-02-13 MED ORDER — HYDROCODONE-ACETAMINOPHEN 5-325 MG PO TABS
1.0000 | ORAL_TABLET | ORAL | Status: AC | PRN
Start: 2014-02-13 — End: ?

## 2014-02-13 MED ORDER — HYDROCODONE-ACETAMINOPHEN 5-325 MG PO TABS
1.0000 | ORAL_TABLET | Freq: Once | ORAL | Status: AC
  Administered 2014-02-13: 1 via ORAL
  Filled 2014-02-13: qty 1

## 2014-02-13 MED ORDER — METHOCARBAMOL 500 MG PO TABS: 500.0000 mg | ORAL_TABLET | Freq: Two times a day (BID) | ORAL | Status: AC

## 2014-02-13 MED ORDER — NAPROXEN 500 MG PO TABS: 500.0000 mg | ORAL_TABLET | Freq: Two times a day (BID) | ORAL | Status: AC

## 2014-02-13 NOTE — Discharge Instructions (Signed)
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
## 2014-02-13 NOTE — ED Notes (Signed)
MD at bedside.  EDP Fayrene Fearing present at bs to clear pt. Barbara Cower RN present to assist. April EMT assisting
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
## 2014-04-25 ENCOUNTER — Other Ambulatory Visit: Payer: Self-pay | Admitting: *Deleted

## 2014-04-25 MED ORDER — AMLODIPINE BESYLATE 10 MG PO TABS: 10.0000 mg | ORAL_TABLET | Freq: Every day | ORAL | Status: AC

## 2014-04-25 MED ORDER — METOPROLOL SUCCINATE ER 100 MG PO TB24: ORAL_TABLET | ORAL | Status: AC
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
